# Patient Record
Sex: Female | Born: 1960
Health system: Southern US, Community
[De-identification: ages and names within clinical notes are randomized; demographics above are authoritative.]

## PROBLEM LIST (undated history)

## (undated) HISTORY — PX: ABDOMINAL HYSTERECTOMY: SHX81

## (undated) HISTORY — PX: HEMORRHOID SURGERY: SHX153

---

## 2003-10-24 ENCOUNTER — Inpatient Hospital Stay (HOSPITAL_COMMUNITY): Admission: RE | Admit: 2003-10-24 | Discharge: 2003-10-31 | Payer: Self-pay | Admitting: General Surgery

## 2005-03-06 ENCOUNTER — Ambulatory Visit (HOSPITAL_COMMUNITY): Admission: RE | Admit: 2005-03-06 | Discharge: 2005-03-06 | Payer: Self-pay | Admitting: Pulmonary Disease

## 2005-04-20 IMAGING — CR DG CHEST 2V
2 series · 2 of 2 positions shown · non-contrast
Comparison: none

CLINICAL DATA: Fever status post hemorrhoidectomy.
 CHEST TWO VIEWS 
 Normal heart size, mediastinal contours, and vascularity.  Subsegmental atelectasis left lower lobe.  Mild bronchitic changes.  Lungs otherwise clear.  Bones unremarkable. 
 IMPRESSION
 Subsegmental atelectasis left lower lobe.

[view not recorded (1 of 2)]
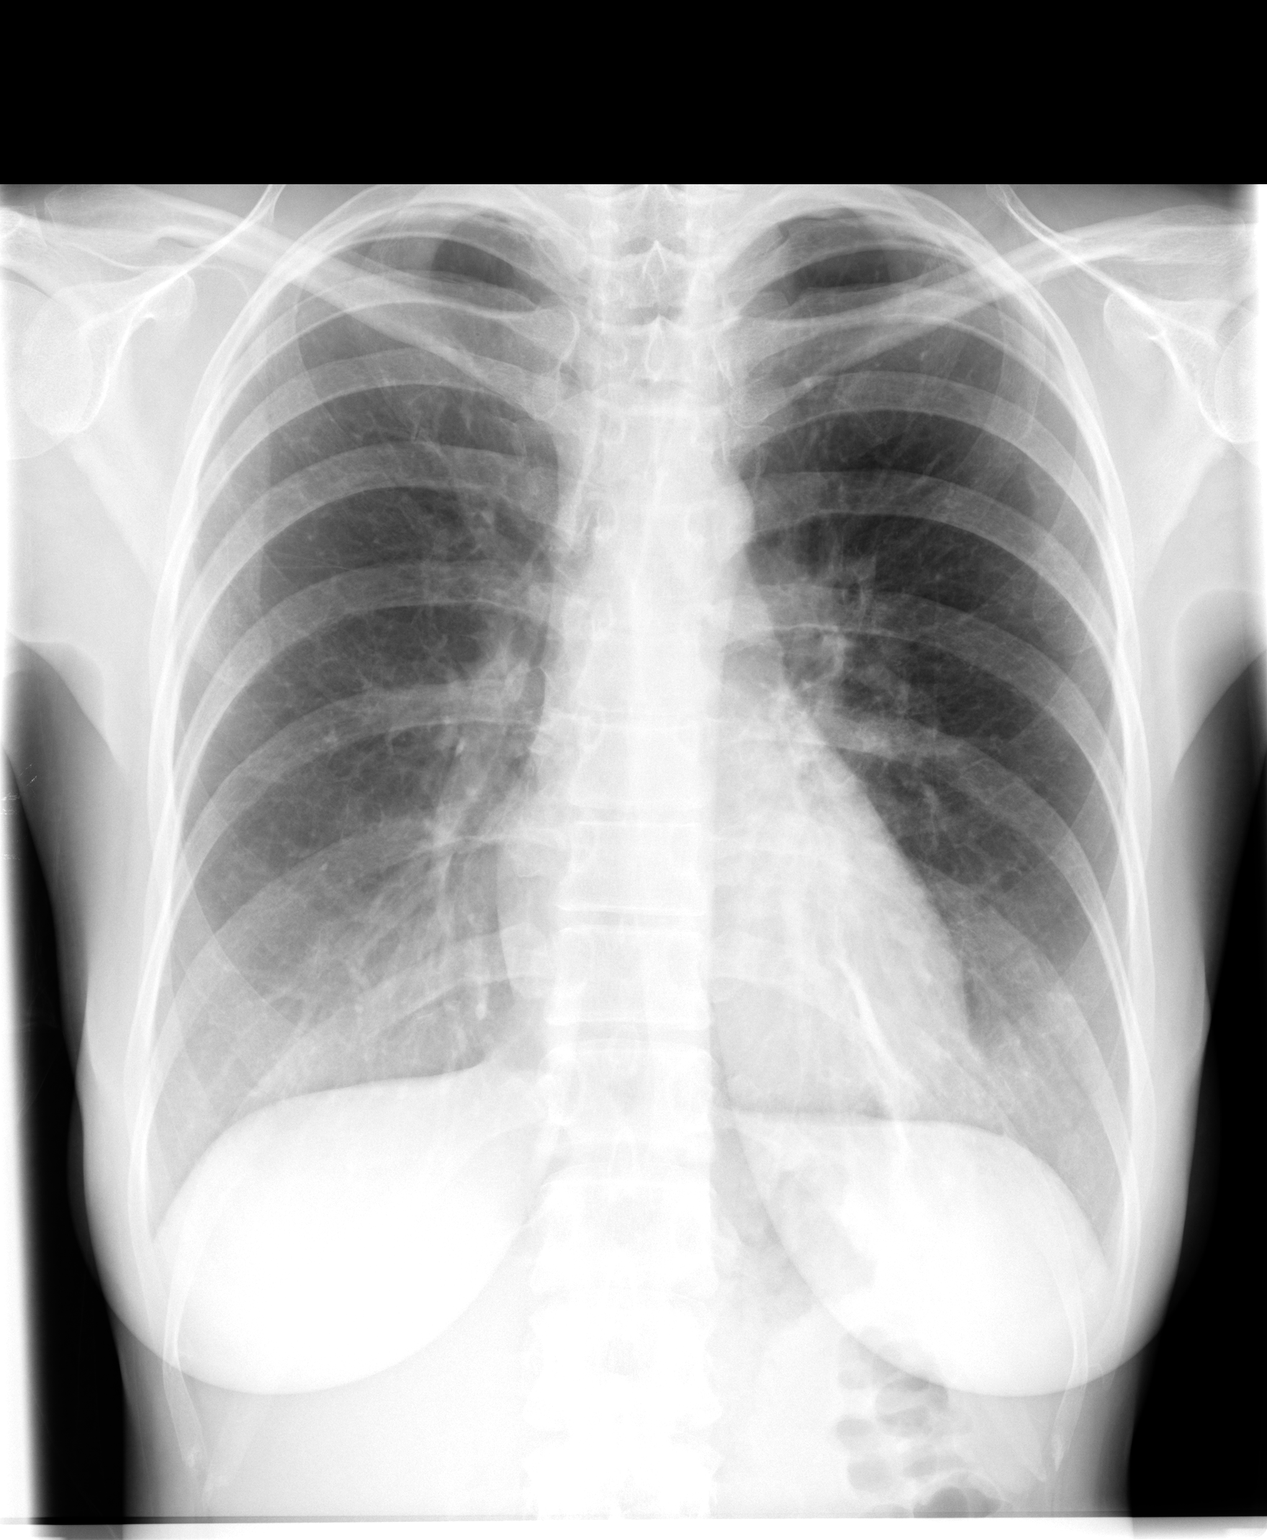

[view not recorded (2 of 2)]
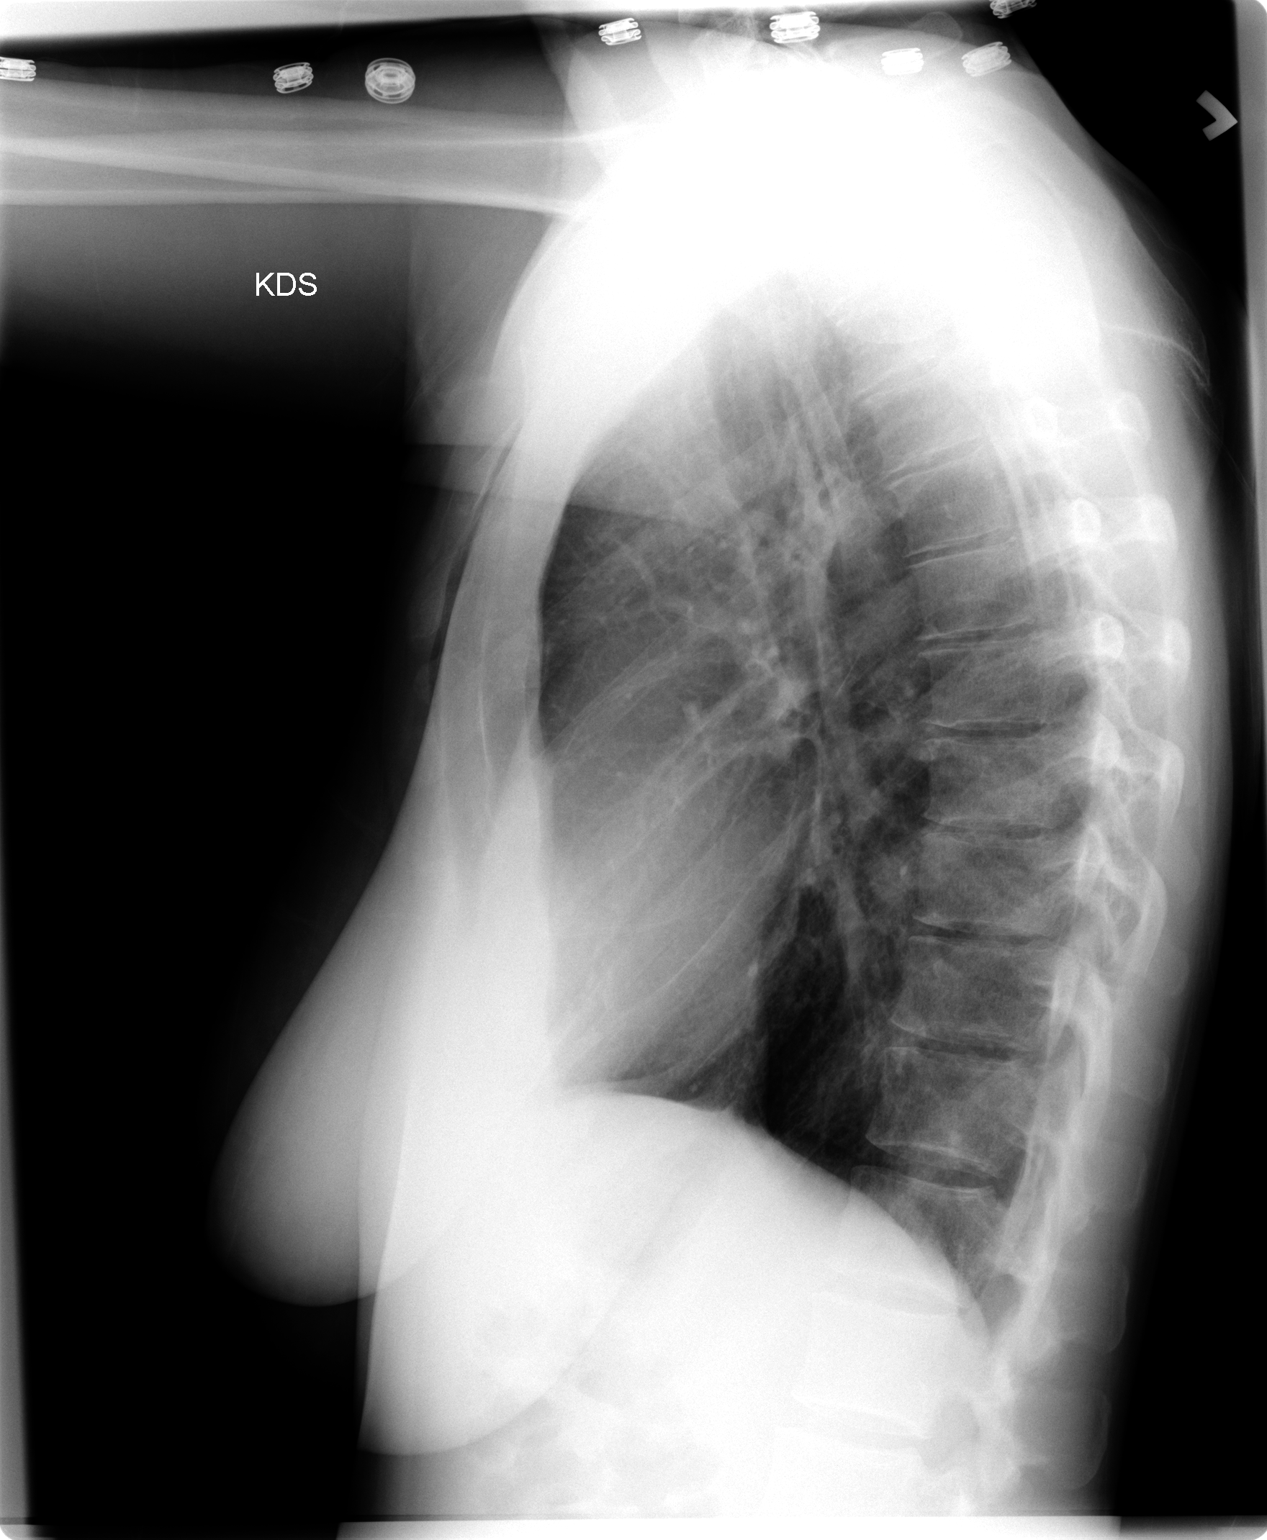

[2 of 2 positions shown; findings below may reference images not displayed]

## 2005-07-02 ENCOUNTER — Ambulatory Visit (HOSPITAL_COMMUNITY): Admission: RE | Admit: 2005-07-02 | Discharge: 2005-07-02 | Payer: Self-pay | Admitting: Pulmonary Disease

## 2006-06-21 ENCOUNTER — Ambulatory Visit (HOSPITAL_COMMUNITY): Admission: RE | Admit: 2006-06-21 | Discharge: 2006-06-21 | Payer: Self-pay | Admitting: Pulmonary Disease

## 2007-11-28 ENCOUNTER — Encounter (INDEPENDENT_AMBULATORY_CARE_PROVIDER_SITE_OTHER): Payer: Self-pay | Admitting: *Deleted

## 2007-11-28 ENCOUNTER — Ambulatory Visit (HOSPITAL_COMMUNITY): Admission: RE | Admit: 2007-11-28 | Discharge: 2007-11-28 | Payer: Self-pay | Admitting: *Deleted

## 2010-05-01 ENCOUNTER — Ambulatory Visit (HOSPITAL_COMMUNITY)
Admission: RE | Admit: 2010-05-01 | Discharge: 2010-05-01 | Payer: Self-pay | Source: Home / Self Care | Attending: Pulmonary Disease | Admitting: Pulmonary Disease

## 2010-10-04 NOTE — Op Note (Signed)
NAME:  Christine Marquez, Christine Marquez              ACCOUNT NO.:  [id number]   MEDICAL RECORD NO.:  [id number]          PATIENT TYPE:  OIB   LOCATION:  9319                          FACILITY:  [facility]   PHYSICIAN:  Gloria [place] B. Earlene Plater, M.D.  DATE OF BIRTH:  [birth year]   DATE OF PROCEDURE:  DATE OF DISCHARGE:  11/28/2007                               OPERATIVE REPORT   PREOPERATIVE DIAGNOSES:  Abnormal bleeding and fibroids.   POSTOPERATIVE DIAGNOSES:  Abnormal bleeding and fibroids.   PROCEDURES:  Total laparoscopic hysterectomy and right salpingo-  oophorectomy.   SURGEON:  Chester Holstein. Earlene Plater, MD   ASSISTANT:  Lendon Colonel, MD   ANESTHESIA:  General.   FINDINGS:  A 250-gram uterus and cervix, Fitz-Hugh-Curtis type changes  at the anterior surface of the liver to the intra-abdominal wall, and  right ovary adherent to the uterus posteriorly.   SPECIMEN:  Uterus, cervix, right tube, and ovary to pathology.   BLOOD LOSS:  100.   COMPLICATIONS:  None.   INDICATIONS:  The patient with a history of heavy menstrual bleeding not  responding to nonsteroidals and hormonal manipulation.  Ultrasound  showed intramural fibroid, otherwise no abnormalities.  The patient  advised the risks of surgery including infection, bleeding, and damage  to surrounding organs.   PROCEDURE:  The patient was taken to the operating room and general  anesthesia was obtained.  She was prepped and draped in a standard  fashion.  A Foley catheter inserted to the bladder.  Uterus sounded to 6  cm and #6 RUMI tip sampled, inserted, and secured.   A 10-mm incision placed in umbilicus.  Fascia was divided sharply and  elevated.  Posterior sheath and peritoneum elevated sharply.  Pursestring suture of 0 Vicryl placed on the fascial defect.  Hasson  cannula inserted and secured.  Pneumoperitoneum obtained with CO2 gas.  The 5 mm ports placed bilaterally periumbilically in left lower quadrant  all under direct laparoscopic  visualization.   A Trendelenburg position obtained.  Bowel mobilized superiorly.  Pelvis  inspected with above findings noted.  Course of the ureters identified  and found to be well away.  The left round ligament was placed on  traction, sealed and divided with the harmonic.  Left tube and left  utero-ovarian pedicle similarly sealed and divided.  Bladder flap was  dissected sharply with the harmonic.  Left broad ligament dissected  sharply with the harmonic to expose uterine artery.   The left uterine artery was then sealed and divided with gyrus bipolar  at the level of the KOH ring.   The right cornu was slightly deviated to the right of the midline, some  adhesions, and shortening of the right round ligament.  It was sealed  and divided with the harmonic.  Right ovary was adherent to the uterus  and the dissection with the harmonic part of the blood supply from the  IP ligament was compromised.  Therefore, I decided to remove the ovary  rather than leave it and have increased risk for necrosis.  Therefore,  the entire right IP ligament was sealed and  divided with a gyrus bipolar  and divided sharply.  The broad ligament was then skeletonized with the  harmonic and the right uterine artery sealed and divided with the gyrus  bipolar at the level of the KOH ring.   Anterior colpotomy was made with a harmonic scalpel on the ring  posteriorly in similar fashion.  Angles were taken down with bipolar.  Uterus, cervix, right tube, and ovary were delivered into the vagina.  This maintained pneumoperitoneum.   The vaginal cuff was inspected.  Course of the ureter reinspected on the  right and although appeared to be clear of the line of dissection to be  sure and decided to proceed with cystoscopy after closure of the vaginal  cuff, therefore indigo carmine was given IV at this point.   The vaginal cuff was then closed with running stitches of the Quill  suture.   Pelvis was  irrigated.  The lines of dissection were hemostatic under low  pressure.  Scope was removed and gas released and cystoscopy performed.  Bilateral ureteral orifices identified with the efflux freely from each  orifice.  The remainder of bladder appeared normal and atraumatic.   The laparoscope reinserted.  Pelvis reinspected.  No abnormalities seen.  Therefore, the case was terminated.  The inferior ports were removed.  Their sites were hemostatic.   Scope was removed.  Gas released.  Hasson cannula removed.  Umbilical  incision elevated with [employer] retractors.  Pursestring suture snugged  down.  This obliterated the fascial defect and no intra-abdominal  contents herniated through prior closure.  Skin was closed with 4-0  Vicryl and Dermabond.   The patient tolerated the procedure well with no complications.  She was  taken to the recovery room awake, alert, and in stable condition.  All  counts correct per the operating staff.      Gerri Spore B. Earlene Plater, M.D.  Electronically Signed     WBD/MEDQ  D:  11/28/2007  T:  11/29/2007  Job:  [id number]

## 2010-10-07 NOTE — H&P (Signed)
NAME:  Christine Marquez, Christine Marquez                        ACCOUNT NO.:  [id number]   MEDICAL RECORD NO.:  [id number]                   PATIENT TYPE:  AMB   LOCATION:  DAY                                  FACILITY:  [facility]   PHYSICIAN:  Jerolyn Shin C. Katrinka Blazing, M.D.                DATE OF BIRTH:  July 08, 1960   DATE OF ADMISSION:  DATE OF DISCHARGE:                                HISTORY & PHYSICAL   HISTORY OF PRESENT ILLNESS:  [age]-year-old female with history of  hemorrhoid disease.  She had hemorrhoidectomy in 1992.  She presented with  the acute onset of painful hemorrhoids with prolapse on 27 April.  She was  treated nonoperatively with some reduction but she remains symptomatic.  She  is now scheduled for hemorrhoidectomy.   PAST HISTORY:  She has no chronic illnesses.   MEDICATIONS:  She takes no medications.   SURGERY:  1. Hemorrhoidectomy in 1992.  2. Left breast biopsy.   ALLERGIES:  PENICILLIN.   FAMILY HISTORY:  Atherosclerotic heart disease, diabetes mellitus,  depression.   PHYSICAL EXAMINATION:  VITAL SIGNS:  On examination, blood pressure 120/70,  pulse 76, respirations 18, weight 137 pounds.  HEENT:  Unremarkable.  NECK:  Neck is supple.  No JVD or bruit.  CHEST:  Chest clear to auscultation.  HEART:  Heart regular rate and rhythm without murmur, gallop or rub.  ABDOMEN:  Abdomen is soft and nontender, no masses.  EXTREMITIES:  No cyanosis, clubbing or edema.  RECTAL:  Prolapse of irritated swollen internal and external hemorrhoids.  NEUROLOGIC EXAM:  No focal motor, sensory or cerebellar deficit.   IMPRESSION:  Hemorrhoid disease.   PLAN:  Hemorrhoidectomy.     ___________________________________________                                         Dirk Dress Katrinka Blazing, M.D.   LCS/MEDQ  D:  10/22/2003  T:  10/23/2003  Job:  [id number]

## 2010-10-07 NOTE — Op Note (Signed)
NAME:  Christine Marquez, Christine Marquez                        ACCOUNT NO.:  [id number]   MEDICAL RECORD NO.:  [id number]                   PATIENT TYPE:  AMB   LOCATION:  DAY                                  FACILITY:  [facility]   PHYSICIAN:  Jerolyn Shin C. Katrinka Blazing, M.D.                DATE OF BIRTH:  [birth year]   DATE OF PROCEDURE:  10/23/2003  DATE OF DISCHARGE:                                 OPERATIVE REPORT   PREOPERATIVE DIAGNOSIS:  Internal and external hemorrhoids.   POSTOPERATIVE DIAGNOSIS:  Internal and external hemorrhoids.   PROCEDURE:  Internal and external hemorrhoidectomy.   SURGEON:  Dirk Dress. Katrinka Blazing, M.D.   DESCRIPTION OF PROCEDURE:  Under spinal anesthesia, the perianal area and  the anal canal were prepped and draped in a sterile field.   Inspection revealed four major complexes of hemorrhoidal cushions.  Each  cushion was ligated at the apex with 2-0 chromic suture.  Each of the  internal and external components were excised, taking care not to injure the  sphincter complexes.  Hemostasis was achieved.  The mucosa and submucosa and  skin were closed with running locking 2-0 chromic.  This was done in four  different __________.  Cosmetic result was good.  The anal sphincter was not  significantly tight.  The area was infiltrated with 30 cc of 0.5% Marcaine  with epinephrine.   The patient tolerated the procedure well.  She was transferred to a bed and  taken to the postanesthetic care unit for monitoring.      ___________________________________________                                            Dirk Dress. Katrinka Blazing, M.D.   LCS/MEDQ  D:  10/23/2003  T:  10/23/2003  Job:  [id number]

## 2010-10-07 NOTE — Discharge Summary (Signed)
NAME:  Christine Marquez, Christine Marquez                        ACCOUNT NO.:  [id number]   MEDICAL RECORD NO.:  [id number]                   PATIENT TYPE:  INP   LOCATION:  A308                                 FACILITY:  [facility]   PHYSICIAN:  Dirk Dress. Katrinka Blazing, M.D.                DATE OF BIRTH:  01-18-1961   DATE OF ADMISSION:  10/23/2003  DATE OF DISCHARGE:  10/31/2003                                 DISCHARGE SUMMARY   DISCHARGE DIAGNOSES:  1. Hemorrhoid disease.  2. Postoperative ileus.  3. Postoperative pneumonia.  4. Postoperative urinary retention.  5. Postoperative anemia.  6. Perioral herpes simplex.   DISPOSITION:  The patient is discharged home in a stable and improved  condition.   She will have followup in the office in two weeks post discharge.   DISCHARGE MEDICATIONS:  1. Dilaudid 4 mg q.4h. p.r.n. pain.  2. Milk of Magnesia 30 cc two or three times daily.  3. Reglan 10 mg a.c. and h.s.  4. Hemocyte Plus one every day.  5. Potassium chloride 20 mEq every day.  6. Levaquin 750 mg every day.  7. Novacort gel to apply to perianal area every four hours as needed.   SUMMARY:  A 50 year old female with a history of hemorrhoid disease.  She is  status post hemorrhoidectomy in 1992.  She had acute onset of painful  hemorrhoids with prolapse on September 16, 2003.  She was treated non-  operatively with some reduction in size of her hemorrhoids, but she remained  symptomatic.  She was scheduled for a hemorrhoidectomy.  Past history is  unremarkable otherwise.  General examination was unremarkable, except for  prolapse of irritated, swollen, internal and external hemorrhoids.   HOSPITAL COURSE:  The patient was admitted through day surgery, on October 23, 2003, and underwent internal and external hemorrhoidectomy with removal of  four segments of hemorrhoidal cushions.  In the immediate postoperative  period, she had difficulty voiding and a post void residual revealed 900 cc.  There was some  perianal ecchymosis.  We tried to do in-and-out  catheterizations, but she continued to have significant post void residual,  so the Foley catheter was left in place.  Her discharge was delayed because  of this.  On the 6th, she developed temperature elevations to 103.  There  was some concern about the possibility of perianal and perirectal  infections, so she was started on Flagyl and Zosyn.  The next day she had  multiple episodes of nausea with vomiting, and she had multiple liquid  stools.  A CT of the abdomen was done and this only revealed a large volume  of colon and small bowel gas with stool compatible with adynamic ileus.  A  CT of the chest showed left lower lobe infiltrate with associated  atelectasis compatible with pneumonitis.  She was continued on the Zosyn and  Levaquin was added.  The Flagyl was  discontinued.  By October 28, 2003, she was  having multiple bowel movements, but she was tachycardic.  Temperatures were  maximally about 100 degrees.  Clinically her left lower lobe pneumonitis was  improved.  Her ileus was also improved.  By October 29, 2003, she felt much  better.  She was afebrile.  Her abdominal exam was benign, but she developed  some perioral fever blisters involving the upper and lower lips.  This was  treated with Zovirax.  By the 11th, she was significantly improved.  She was  having regular bowel movements.  She was afebrile.  Vital signs were stable.  Lungs were clear.  The perianal area revealed mild ecchymosis with swelling  of the external skin tags.  The herpetic lesions around her mouth were  improved, and her pneumonitis was resolving.  It was felt that she was  stable enough to be discharged, and she was discharged home on the above  medications with plans for followup in two weeks.     ___________________________________________                                         Dirk Dress Katrinka Blazing, M.D.   LCS/MEDQ  D:  12/05/2003  T:  12/05/2003  Job:  [id number]

## 2011-02-16 LAB — CBC
HCT: 33.3 — ABNORMAL LOW
Hemoglobin: 11.4 — ABNORMAL LOW
MCHC: 34.4
MCV: 91.7
Platelets: 264
RBC: 3.63 — ABNORMAL LOW
RDW: 14
WBC: 4.5

## 2011-02-16 LAB — DIFFERENTIAL
Basophils Absolute: 0
Basophils Relative: 0
Eosinophils Absolute: 0
Eosinophils Relative: 1
Lymphocytes Relative: 30
Lymphs Abs: 1.4
Monocytes Absolute: 0.4
Monocytes Relative: 10
Neutro Abs: 2.6
Neutrophils Relative %: 59

## 2011-02-16 LAB — PREGNANCY, URINE: Preg Test, Ur: NEGATIVE

## 2013-09-28 ENCOUNTER — Other Ambulatory Visit: Payer: Self-pay | Admitting: Pulmonary Disease

## 2013-09-28 ENCOUNTER — Ambulatory Visit (HOSPITAL_COMMUNITY)
Admission: RE | Admit: 2013-09-28 | Discharge: 2013-09-28 | Disposition: A | Discharge status: Home / Self Care | Payer: 59 | Source: Ambulatory Visit | Attending: Pulmonary Disease | Admitting: Pulmonary Disease

## 2013-09-28 DIAGNOSIS — R091 Pleurisy: Secondary | ICD-10-CM

## 2013-09-28 DIAGNOSIS — R071 Chest pain on breathing: Secondary | ICD-10-CM | POA: Insufficient documentation

## 2013-10-03 ENCOUNTER — Other Ambulatory Visit (HOSPITAL_COMMUNITY): Payer: Self-pay | Admitting: Pulmonary Disease

## 2013-10-03 DIAGNOSIS — K30 Functional dyspepsia: Secondary | ICD-10-CM

## 2013-10-06 ENCOUNTER — Ambulatory Visit (HOSPITAL_COMMUNITY)
Admission: RE | Admit: 2013-10-06 | Discharge: 2013-10-06 | Disposition: A | Discharge status: Home / Self Care | Payer: 59 | Source: Ambulatory Visit | Attending: Pulmonary Disease | Admitting: Pulmonary Disease

## 2013-10-06 DIAGNOSIS — K3189 Other diseases of stomach and duodenum: Secondary | ICD-10-CM | POA: Insufficient documentation

## 2013-10-06 DIAGNOSIS — R1013 Epigastric pain: Principal | ICD-10-CM

## 2013-10-06 DIAGNOSIS — K30 Functional dyspepsia: Secondary | ICD-10-CM

## 2013-10-07 ENCOUNTER — Ambulatory Visit (HOSPITAL_COMMUNITY): Payer: 59

## 2014-11-17 ENCOUNTER — Emergency Department (HOSPITAL_COMMUNITY): Payer: 59

## 2014-11-17 ENCOUNTER — Emergency Department (HOSPITAL_COMMUNITY)
Admission: EM | Admit: 2014-11-17 | Discharge: 2014-11-17 | Disposition: A | Discharge status: Home / Self Care | Payer: 59 | Attending: Emergency Medicine | Admitting: Emergency Medicine

## 2014-11-17 ENCOUNTER — Encounter (HOSPITAL_COMMUNITY): Payer: Self-pay | Admitting: Emergency Medicine

## 2014-11-17 DIAGNOSIS — R079 Chest pain, unspecified: Secondary | ICD-10-CM | POA: Diagnosis not present

## 2014-11-17 DIAGNOSIS — R200 Anesthesia of skin: Secondary | ICD-10-CM | POA: Diagnosis not present

## 2014-11-17 DIAGNOSIS — Z880 Allergy status to penicillin: Secondary | ICD-10-CM | POA: Diagnosis not present

## 2014-11-17 DIAGNOSIS — M549 Dorsalgia, unspecified: Secondary | ICD-10-CM | POA: Insufficient documentation

## 2014-11-17 DIAGNOSIS — H538 Other visual disturbances: Secondary | ICD-10-CM | POA: Insufficient documentation

## 2014-11-17 DIAGNOSIS — R202 Paresthesia of skin: Secondary | ICD-10-CM | POA: Insufficient documentation

## 2014-11-17 DIAGNOSIS — Z720 Tobacco use: Secondary | ICD-10-CM | POA: Insufficient documentation

## 2014-11-17 LAB — COMPREHENSIVE METABOLIC PANEL
ALT: 20 U/L (ref 14–54)
AST: 21 U/L (ref 15–41)
Albumin: 4.4 g/dL (ref 3.5–5.0)
Alkaline Phosphatase: 92 U/L (ref 38–126)
Anion gap: 8 (ref 5–15)
BUN: 13 mg/dL (ref 6–20)
CO2: 31 mmol/L (ref 22–32)
Calcium: 9.3 mg/dL (ref 8.9–10.3)
Chloride: 105 mmol/L (ref 101–111)
Creatinine, Ser: 0.58 mg/dL (ref 0.44–1.00)
GFR calc Af Amer: >60 mL/min (ref >60)
GFR calc non Af Amer: >60 mL/min (ref >60)
Glucose, Bld: 87 mg/dL (ref 65–99)
Potassium: 4.4 mmol/L (ref 3.5–5.1)
Sodium: 144 mmol/L (ref 135–145)
Total Bilirubin: 0.8 mg/dL (ref 0.3–1.2)
Total Protein: 7.1 g/dL (ref 6.5–8.1)

## 2014-11-17 LAB — CBC WITH DIFFERENTIAL/PLATELET
Basophils Absolute: 0 10*3/uL (ref 0.0–0.1)
Basophils Relative: 0 % (ref 0–1)
Eosinophils Absolute: 0 10*3/uL (ref 0.0–0.7)
Eosinophils Relative: 1 % (ref 0–5)
HCT: 40.1 % (ref 36.0–46.0)
Hemoglobin: 12.9 g/dL (ref 12.0–15.0)
Lymphocytes Relative: 29 % (ref 12–46)
Lymphs Abs: 1.5 10*3/uL (ref 0.7–4.0)
MCH: 31.4 pg (ref 26.0–34.0)
MCHC: 32.2 g/dL (ref 30.0–36.0)
MCV: 97.6 fL (ref 78.0–100.0)
Monocytes Absolute: 0.4 10*3/uL (ref 0.1–1.0)
Monocytes Relative: 8 % (ref 3–12)
Neutro Abs: 3.3 10*3/uL (ref 1.7–7.7)
Neutrophils Relative %: 62 % (ref 43–77)
Platelets: 186 10*3/uL (ref 150–400)
RBC: 4.11 MIL/uL (ref 3.87–5.11)
RDW: 13.9 % (ref 11.5–15.5)
WBC: 5.2 10*3/uL (ref 4.0–10.5)

## 2014-11-17 NOTE — ED Notes (Signed)
Pt made aware to return if symptoms worsen or if any life threatening symptoms occur.   

## 2014-11-17 NOTE — Discharge Instructions (Signed)
Follow-up with your doctor. Make an appointment. Also referral information given to neurology Dr. Merlene Laughter if youneed to follow-up with him. Return for any new or worse symptoms.

## 2014-11-17 NOTE — ED Notes (Signed)
Pt reports intermittent episodes of R sided facial numbness and tingling since last week. No neuro deficits noted.

## 2014-11-17 NOTE — ED Provider Notes (Addendum)
CSN: [id number]     Arrival date & time 11/17/14  1109 History  This chart was scribed for Fredia Sorrow, MD by Stephania Fragmin, ED Scribe. This patient was seen in room APA18/APA18 and the patient's care was started at 12:48 PM.    Chief Complaint  Patient presents with  . Numbness   The history is provided by the patient. No language interpreter was used.     HPI Comments: Christine Marquez is a 54 y.o. female who presents to the Emergency Department complaining of 3 intermittent episodes of numbness around her generalized face, right greater than left, that began last week and with the latest episode occurring PTA. She also complains of intermittent center chest pain radiating to her back that has been ongoing for a few weeks, although she adds she is unsure whether it is back pain radiating to her chest or vice versa. She also reports blurred vision in her bilateral eyes that has been ongoing for 2-3 months, which was diagnosed by her doctor as cataracts, but she denies any recent acute changes. She also denies fevers, chills, cough, rhinorrhea, sore throat, SOB, abdominal pain, nausea, vomiting, diarrhea, dysuria, leg swelling, bleeding easily, rash, headache, weakness, or numbness on any area of her body besides her head.  PCP: Dr. Nevada Crane  History reviewed. No pertinent past medical history. Past Surgical History  Procedure Laterality Date  . Hemorrhoid surgery     Family History  Problem Relation Age of Onset  . Heart failure Father   . Cancer Other   . Diabetes Other    History  Substance Use Topics  . Smoking status: Current Every Day Smoker    Types: Cigarettes  . Smokeless tobacco: Never Used  . Alcohol Use: No   OB History    Gravida Para Term Preterm AB TAB SAB Ectopic Multiple Living   2 2 2             Review of Systems  Constitutional: Negative for fever and chills.  HENT: Negative for rhinorrhea and sore throat.   Eyes: Positive for visual disturbance (blurred  vision).  Respiratory: Negative for cough and shortness of breath.   Cardiovascular: Positive for chest pain. Negative for leg swelling.  Gastrointestinal: Negative for nausea, vomiting, abdominal pain and diarrhea.  Genitourinary: Negative for dysuria.  Musculoskeletal: Positive for back pain.  Skin: Negative for rash.  Neurological: Positive for numbness. Negative for weakness and headaches.  Hematological: Does not bruise/bleed easily.    Allergies  Penicillins  Home Medications   Prior to Admission medications   Medication Sig Start Date End Date Taking? Authorizing Provider  acetaminophen (TYLENOL) 325 MG tablet Take 650 mg by mouth every 6 (six) hours as needed for moderate pain.   Yes Historical Provider, MD   BP 124/80 mmHg  Pulse 78  Temp(Src) 98 F (36.7 C) (Oral)  Resp 15  Ht 5\' 7"  (1.702 m)  Wt 139 lb (63.05 kg)  BMI 21.77 kg/m2  SpO2 99% Physical Exam  Constitutional: She is oriented to person, place, and time. She appears well-developed and well-nourished. No distress.  HENT:  Head: Normocephalic and atraumatic.  Mouth/Throat: Oropharynx is clear and moist.  Moist mucous membranes.  Eyes: Conjunctivae and EOM are normal. Pupils are equal, round, and reactive to light. No scleral icterus.  Eyes track normal.  Neck: Neck supple. No tracheal deviation present.  Cardiovascular: Normal rate, regular rhythm and normal heart sounds.   Pulmonary/Chest: Effort normal. No respiratory distress.  Lungs  are clear to auscultation bilaterally.  Abdominal: Soft. Bowel sounds are normal. There is no tenderness. There is no rebound.  Musculoskeletal: Normal range of motion.  No ankle swelling.  Neurological: She is alert and oriented to person, place, and time.  Skin: Skin is warm and dry.  Psychiatric: She has a normal mood and affect. Her behavior is normal.  Nursing note and vitals reviewed.   ED Course  Procedures (including critical care time)   DIAGNOSTIC  STUDIES: Oxygen Saturation is 96% on RA, normal by my interpretation.    COORDINATION OF CARE: 12:47 PM - Discussed treatment plan with pt at bedside which includes diagnostic lab tests and CT head/MRI, and pt agreed to plan.   Labs Review Labs Reviewed  CBC WITH DIFFERENTIAL/PLATELET  COMPREHENSIVE METABOLIC PANEL   Results for orders placed or performed during the hospital encounter of 11/17/14  CBC with Differential/Platelet  Result Value Ref Range   WBC 5.2 4.0 - 10.5 K/uL   RBC 4.11 3.87 - 5.11 MIL/uL   Hemoglobin 12.9 12.0 - 15.0 g/dL   HCT 40.1 36.0 - 46.0 %   MCV 97.6 78.0 - 100.0 fL   MCH 31.4 26.0 - 34.0 pg   MCHC 32.2 30.0 - 36.0 g/dL   RDW 13.9 11.5 - 15.5 %   Platelets 186 150 - 400 K/uL   Neutrophils Relative % 62 43 - 77 %   Neutro Abs 3.3 1.7 - 7.7 K/uL   Lymphocytes Relative 29 12 - 46 %   Lymphs Abs 1.5 0.7 - 4.0 K/uL   Monocytes Relative 8 3 - 12 %   Monocytes Absolute 0.4 0.1 - 1.0 K/uL   Eosinophils Relative 1 0 - 5 %   Eosinophils Absolute 0.0 0.0 - 0.7 K/uL   Basophils Relative 0 0 - 1 %   Basophils Absolute 0.0 0.0 - 0.1 K/uL  Comprehensive metabolic panel  Result Value Ref Range   Sodium 144 135 - 145 mmol/L   Potassium 4.4 3.5 - 5.1 mmol/L   Chloride 105 101 - 111 mmol/L   CO2 31 22 - 32 mmol/L   Glucose, Bld 87 65 - 99 mg/dL   BUN 13 6 - 20 mg/dL   Creatinine, Ser 0.58 0.44 - 1.00 mg/dL   Calcium 9.3 8.9 - 10.3 mg/dL   Total Protein 7.1 6.5 - 8.1 g/dL   Albumin 4.4 3.5 - 5.0 g/dL   AST 21 15 - 41 U/L   ALT 20 14 - 54 U/L   Alkaline Phosphatase 92 38 - 126 U/L   Total Bilirubin 0.8 0.3 - 1.2 mg/dL   GFR calc non Af Amer >60 >60 mL/min   GFR calc Af Amer >60 >60 mL/min   Anion gap 8 5 - 15     Imaging Review Dg Chest 2 View  11/17/2014   CLINICAL DATA:  Intermittent episodes of right facial numbness and tingling starting last week.  EXAM: CHEST  2 VIEW  COMPARISON:  09/28/2013  FINDINGS: Tapering of the peripheral pulmonary vasculature  favors emphysema.  Stable vague anterior lingular density, not perfectly changed from last year. Heart size within normal limits. Biapical pleural parenchymal scarring. Mild thoracic spondylosis.  No pleural effusion identified.  IMPRESSION: 1. Emphysema. 2. Suspected lingular scarring. 3. No acute findings.   Electronically Signed   By: Van Clines M.D.   On: 11/17/2014 15:25   Ct Head Wo Contrast  11/17/2014   CLINICAL DATA:  Right facial numbness and tingling.  EXAM: CT HEAD WITHOUT CONTRAST  TECHNIQUE: Contiguous axial images were obtained from the base of the skull through the vertex without intravenous contrast.  COMPARISON:  None.  FINDINGS: Bony calvarium appears intact. No mass effect or midline shift is noted. Ventricular size is within normal limits. There is no evidence of mass lesion, hemorrhage or acute infarction.  IMPRESSION: Normal head CT.   Electronically Signed   By: Marijo Conception, M.D.   On: 11/17/2014 14:33   Mr Brain Wo Contrast  11/17/2014   CLINICAL DATA:  Right-sided facial numbness and weakness for 1 day.  EXAM: MRI HEAD WITHOUT CONTRAST  TECHNIQUE: Multiplanar, multiecho pulse sequences of the brain and surrounding structures were obtained without intravenous contrast.  COMPARISON:  CT of the head 11/17/2014  FINDINGS: No acute infarct, hemorrhage, or mass lesion is present. The ventricles are of normal size. There is no significant white matter disease. Minimal periventricular changes are within normal limits for age. No significant extraaxial fluid collection is present.  Flow is present in the major intracranial arteries. The the globes and orbits are intact. The paranasal sinuses and mastoid air cells are clear.  The skullbase is within normal limits. Midline structures are normal.  IMPRESSION: Negative MRI of the brain.   Electronically Signed   By: San Morelle M.D.   On: 11/17/2014 16:28     EKG Interpretation   Date/Time:  Tuesday November 17 2014 11:20:58  EDT Ventricular Rate:  87 PR Interval:  162 QRS Duration: 88 QT Interval:  346 QTC Calculation: 416 R Axis:   63 Text Interpretation:  Sinus rhythm Confirmed by Jermey Closs  MD, Cletus Paris  ([id number]) on 11/17/2014 11:31:24 AM      MDM   Final diagnoses:  Numbness and tingling  Chest pain    Patient resents with intermittent numbness and tingling to the the face right greater than left and to the head. Ongoing goal on and off for a period of several weeks. No tingling or numbness anywhere else in the body. Also associated with some intermittent anterior chest pain radiating to the back. Not consistent. Has been going on for past several days. Patient works Engineer, civil (consulting) as a Marine scientist. Patient primary care doctor is Dr. Luan Pulling.  Head CT was negative labs without any sniffing abnormality. EKG without any acute changes. Chest x-ray negative for any acute cardiopulmonary findings no pneumothorax no pneumonia no pulmonary edema. Clinically not concerned about an acute cardiac event. Also no hypoxia not consistent with a pulmonary embolus.  MRI ordered for completeness sake for the patient.   If MRI negative patient will be discharged for follow-up with her primary care doctor also given referral information to neurology.    MRI without any acute findings. We'll have patient follow-up with her primary care doctor and neurology.    I personally performed the services described in this documentation, which was scribed in my presence. The recorded information has been reviewed and is accurate.      Fredia Sorrow, MD 11/17/14 1624  Fredia Sorrow, MD 11/17/14 [phone]

## 2015-02-28 ENCOUNTER — Telehealth: Payer: 59 | Admitting: Family

## 2015-02-28 DIAGNOSIS — J208 Acute bronchitis due to other specified organisms: Secondary | ICD-10-CM

## 2015-02-28 MED ORDER — BENZONATATE 100 MG PO CAPS: 100.0000 mg | ORAL_CAPSULE | Freq: Three times a day (TID) | ORAL | Status: AC | PRN

## 2015-02-28 NOTE — Progress Notes (Signed)
We are sorry that you are not feeling well.  Here is how we plan to help!  Based on what you have shared with me it looks like you have upper respiratory tract inflammation that has resulted in a significant cough.  Inflammation and infection in the upper respiratory tract is commonly called bronchitis and has four common causes:  Allergies, Viral Infections, Acid Reflux and Bacterial Infections.  Allergies, viruses and acid reflux are treated by controlling symptoms or eliminating the cause. An example might be a cough caused by taking certain blood pressure medications. You stop the cough by changing the medication. Another example might be a cough caused by acid reflux. Controlling the reflux helps control the cough.  Based on your presentation I believe you most likely have A cough due to a virus.  This is called viral bronchitis and is best treated by rest, plenty of fluids and control of the cough.  You may use Ibuprofen or Tylenol as directed to help your symptoms.    In addition you may use A non-prescription cough medication called Robitussin DAC. Take 2 teaspoons every 8 hours or Delsym: take 2 teaspoons every 12 hours. and A prescription cough medication called Tessalon Perles 100mg . You may take 1-2 capsules every 8 hours as needed for your cough.    HOME CARE . Only take medications as instructed by your medical team. . Complete the entire course of an antibiotic. . Drink plenty of fluids and get plenty of rest. . Avoid close contacts especially the very young and the elderly . Cover your mouth if you cough or cough into your sleeve. . Always remember to wash your hands . A steam or ultrasonic humidifier can help congestion.    GET HELP RIGHT AWAY IF: . You develop worsening fever. . You become short of breath . You cough up blood. . Your symptoms persist after you have completed your treatment plan MAKE SURE YOU   Understand these instructions.  Will watch your  condition.  Will get help right away if you are not doing well or get worse.  Your e-visit answers were reviewed by a board certified advanced clinical practitioner to complete your personal care plan.  Depending on the condition, your plan could have included both over the counter or prescription medications. If there is a problem please reply  once you have received a response from your provider. Your safety is important to [place].  If you have drug allergies check your prescription carefully.    You can use MyChart to ask questions about today's visit, request a non-urgent call back, or ask for a work or school excuse for 24 hours related to this e-Visit. If it has been greater than 24 hours you will need to follow up with your provider, or enter a new e-Visit to address those concerns. You will get an e-mail in the next two days asking about your experience.  I hope that your e-visit has been valuable and will speed your recovery. Thank you for using e-visits.

## 2016-01-13 ENCOUNTER — Telehealth: Payer: 59 | Admitting: Physician Assistant

## 2016-01-13 DIAGNOSIS — R399 Unspecified symptoms and signs involving the genitourinary system: Secondary | ICD-10-CM | POA: Diagnosis not present

## 2016-01-13 MED ORDER — NITROFURANTOIN MONOHYD MACRO 100 MG PO CAPS: 100.0000 mg | ORAL_CAPSULE | Freq: Two times a day (BID) | ORAL | 0 refills | Status: DC

## 2016-01-13 NOTE — Progress Notes (Signed)
We are sorry that you are not feeling well.  Here is how we plan to help!  Based on what you shared with me it looks like you most likely have a simple urinary tract infection.  A UTI (Urinary Tract Infection) is a bacterial infection of the bladder.  Most cases of urinary tract infections are simple to treat but a key part of your care is to encourage you to drink plenty of fluids and watch your symptoms carefully.  I have prescribed MacroBid 100 mg twice a day for 5 days.  I cannot send medication to a pharmacy outside state via e-visits. As noted in the disclaimer on the homepage, you need to give me a pharmacy in [place] I can send the prescription to. Please let me know so I can help you feel better!  Your symptoms should gradually improve. Call us if the burning in your urine worsens, you develop worsening fever, back pain or pelvic pain or if your symptoms do not resolve after completing the antibiotic.  Urinary tract infections can be prevented by drinking plenty of water to keep your body hydrated.  Also be sure when you wipe, wipe from front to back and don't hold it in!  If possible, empty your bladder every 4 hours.  Your e-visit answers were reviewed by a board certified advanced clinical practitioner to complete your personal care plan.  Depending on the condition, your plan could have included both over the counter or prescription medications.  If there is a problem please reply  once you have received a response from your provider.  Your safety is important to [place].  If you have drug allergies check your prescription carefully.    You can use MyChart to ask questions about today's visit, request a non-urgent call back, or ask for a work or school excuse for 24 hours related to this e-Visit. If it has been greater than 24 hours you will need to follow up with your provider, or enter a new e-Visit to address those concerns.   You will get an e-mail in the next two days asking about your  experience.  I hope that your e-visit has been valuable and will speed your recovery. Thank you for using e-visits.

## 2016-01-13 NOTE — Addendum Note (Signed)
Addended by: Raiford Noble on: 01/13/2016 08:39 PM   Modules accepted: Orders

## 2016-01-13 NOTE — Progress Notes (Signed)
Awaiting patient message for an [place] pharmacy to send medication to.

## 2016-01-21 DIAGNOSIS — H0289 Other specified disorders of eyelid: Secondary | ICD-10-CM | POA: Diagnosis not present

## 2016-01-21 DIAGNOSIS — H47093 Other disorders of optic nerve, not elsewhere classified, bilateral: Secondary | ICD-10-CM | POA: Diagnosis not present

## 2016-01-21 DIAGNOSIS — H2513 Age-related nuclear cataract, bilateral: Secondary | ICD-10-CM | POA: Diagnosis not present

## 2016-09-24 ENCOUNTER — Telehealth: Payer: 59 | Admitting: Family

## 2016-09-24 DIAGNOSIS — B009 Herpesviral infection, unspecified: Secondary | ICD-10-CM

## 2016-09-24 MED ORDER — ACYCLOVIR 400 MG PO TABS: 800.0000 mg | ORAL_TABLET | Freq: Two times a day (BID) | ORAL | 0 refills | Status: DC

## 2016-09-24 NOTE — Progress Notes (Signed)
Duplicate; delete this E-Visit

## 2016-09-24 NOTE — Progress Notes (Signed)
We are sorry that you are not feeling well.  Here is how we plan to help!  Based on what you have shared with me it does look like you have a viral infection.    Most cold sores or fever blisters are small fluid filled blisters around the mouth caused by herpes simplex virus.  The most common strain of the virus causing cold sores is herpes simplex virus 1.  It can be spread by skin contact, sharing eating utensils, or even sharing towels.  Cold sores are contagious to other people until dry. (Approximately 5-7 days).  Wash your hands. You can spread the virus to your eyes through handling your contact lenses after touching the lesions.  Most people experience pain at the sight or tingling sensations in their lips that may begin before the ulcers erupt.  Herpes simplex is treatable but not curable.  It may lie dormant for a long time and then reappear due to stress or prolonged sun exposure.  Many patients have success in treating their cold sores with an over the counter topical called Abreva.  You may apply the cream up to 5 times daily (maximum 10 days) until healing occurs.  If you would like to use an oral antiviral medication to speed the healing of your cold sore, I have sent a prescription to your local pharmacy Acyclovir 800 mg twice a day for 7 days    HOME CARE:   Wash your hands frequently.  Do not pick at or rub the sore.  Don't open the blisters.  Avoid kissing other people during this time.  Avoid sharing drinking glasses, eating utensils, or razors.  Do not handle contact lenses unless you have thoroughly washed your hands with soap and warm water!  Avoid oral sex during this time.  Herpes from sores on your mouth can spread to your partner's genital area.  Avoid contact with anyone who has eczema or a weakened immune system.  Cold sores are often triggered by exposure to intense sunlight, use a lip balm containing a sunscreen (SPF 30 or higher).  GET HELP RIGHT AWAY  IF:   Blisters look infected.  Blisters occur near or in the eye.  Symptoms last longer than 10 days.  Your symptoms become worse.  MAKE SURE YOU:   Understand these instructions.  Will watch your condition.  Will get help right away if you are not doing well or get worse.    Your e-visit answers were reviewed by a board certified advanced clinical practitioner to complete your personal care plan.  Depending upon the condition, your plan could have  Included both over the counter or prescription medications.    Please review your pharmacy choice.  Be sure that the pharmacy you have chosen is open so that you can pick up your prescription now.  If there is a problem you csn message your provider in MyChart to have the prescription routed to another pharmacy.    Your safety is important to us.  If you have drug allergies check our prescription carefully.  For the next 24 hours you can use MyChart to ask questions about today's visit, request a non-urgent call back, or ask for a work or school excuse from your e-visit provider.  You will get an email in the next two days asking about your experience.  I hope that your e-visit has been valuable and will speed your recovery.  

## 2017-10-04 DIAGNOSIS — D485 Neoplasm of uncertain behavior of skin: Secondary | ICD-10-CM | POA: Diagnosis not present

## 2017-10-04 DIAGNOSIS — H2513 Age-related nuclear cataract, bilateral: Secondary | ICD-10-CM | POA: Diagnosis not present

## 2017-10-04 DIAGNOSIS — D3102 Benign neoplasm of left conjunctiva: Secondary | ICD-10-CM | POA: Diagnosis not present

## 2017-11-06 ENCOUNTER — Other Ambulatory Visit (HOSPITAL_COMMUNITY): Payer: Self-pay | Admitting: Pulmonary Disease

## 2017-11-06 DIAGNOSIS — Z780 Asymptomatic menopausal state: Secondary | ICD-10-CM

## 2017-11-06 DIAGNOSIS — Z0000 Encounter for general adult medical examination without abnormal findings: Secondary | ICD-10-CM | POA: Diagnosis not present

## 2017-11-07 ENCOUNTER — Encounter (HOSPITAL_COMMUNITY): Payer: Self-pay

## 2017-11-07 ENCOUNTER — Ambulatory Visit (HOSPITAL_COMMUNITY)
Admission: RE | Admit: 2017-11-07 | Discharge: 2017-11-07 | Disposition: A | Discharge status: Home / Self Care | Payer: 59 | Source: Ambulatory Visit | Attending: Pulmonary Disease | Admitting: Pulmonary Disease

## 2017-11-07 ENCOUNTER — Other Ambulatory Visit (HOSPITAL_COMMUNITY)
Admission: RE | Admit: 2017-11-07 | Discharge: 2017-11-07 | Disposition: A | Discharge status: Home / Self Care | Payer: 59 | Source: Ambulatory Visit | Attending: Pulmonary Disease | Admitting: Pulmonary Disease

## 2017-11-07 ENCOUNTER — Other Ambulatory Visit (HOSPITAL_COMMUNITY): Payer: Self-pay | Admitting: Pulmonary Disease

## 2017-11-07 DIAGNOSIS — K30 Functional dyspepsia: Secondary | ICD-10-CM | POA: Insufficient documentation

## 2017-11-07 DIAGNOSIS — Z1231 Encounter for screening mammogram for malignant neoplasm of breast: Secondary | ICD-10-CM | POA: Diagnosis not present

## 2017-11-07 DIAGNOSIS — Z0000 Encounter for general adult medical examination without abnormal findings: Secondary | ICD-10-CM | POA: Insufficient documentation

## 2017-11-07 DIAGNOSIS — F17200 Nicotine dependence, unspecified, uncomplicated: Secondary | ICD-10-CM | POA: Insufficient documentation

## 2017-11-07 LAB — COMPREHENSIVE METABOLIC PANEL
ALBUMIN: 4.5 g/dL (ref 3.5–5.0)
ALK PHOS: 96 U/L (ref 38–126)
ALT: 21 U/L (ref 14–54)
ANION GAP: 9 (ref 5–15)
AST: 25 U/L (ref 15–41)
BUN: 20 mg/dL (ref 6–20)
CALCIUM: 9.3 mg/dL (ref 8.9–10.3)
CO2: 28 mmol/L (ref 22–32)
Chloride: 104 mmol/L (ref 101–111)
Creatinine, Ser: 0.67 mg/dL (ref 0.44–1.00)
GFR calc Af Amer: >60 mL/min (ref >60)
GFR calc non Af Amer: >60 mL/min (ref >60)
Glucose, Bld: 98 mg/dL (ref 65–99)
Potassium: 4.2 mmol/L (ref 3.5–5.1)
SODIUM: 141 mmol/L (ref 135–145)
Total Bilirubin: 0.5 mg/dL (ref 0.3–1.2)
Total Protein: 7.6 g/dL (ref 6.5–8.1)

## 2017-11-07 LAB — LIPID PANEL
Cholesterol: 196 mg/dL (ref 0–200)
HDL: 82 mg/dL (ref >40)
LDL Cholesterol: 107 mg/dL — ABNORMAL HIGH (ref 0–99)
Total CHOL/HDL Ratio: 2.4 RATIO
Triglycerides: 34 mg/dL (ref <150)
VLDL: 7 mg/dL (ref 0–40)

## 2017-11-07 LAB — CBC
HEMATOCRIT: 43.5 % (ref 36.0–46.0)
HEMOGLOBIN: 13.9 g/dL (ref 12.0–15.0)
MCH: 31.7 pg (ref 26.0–34.0)
MCHC: 32 g/dL (ref 30.0–36.0)
MCV: 99.3 fL (ref 78.0–100.0)
Platelets: 218 10*3/uL (ref 150–400)
RBC: 4.38 MIL/uL (ref 3.87–5.11)
RDW: 13.8 % (ref 11.5–15.5)
WBC: 5.3 10*3/uL (ref 4.0–10.5)

## 2017-11-07 LAB — TSH: TSH: 0.81 u[IU]/mL (ref 0.350–4.500)

## 2017-11-13 ENCOUNTER — Ambulatory Visit (HOSPITAL_COMMUNITY)
Admission: RE | Admit: 2017-11-13 | Discharge: 2017-11-13 | Disposition: A | Discharge status: Home / Self Care | Payer: 59 | Source: Ambulatory Visit | Attending: Pulmonary Disease | Admitting: Pulmonary Disease

## 2017-11-13 DIAGNOSIS — Z780 Asymptomatic menopausal state: Secondary | ICD-10-CM | POA: Diagnosis not present

## 2017-11-13 DIAGNOSIS — M85852 Other specified disorders of bone density and structure, left thigh: Secondary | ICD-10-CM | POA: Diagnosis not present

## 2017-11-16 DIAGNOSIS — Z1212 Encounter for screening for malignant neoplasm of rectum: Secondary | ICD-10-CM | POA: Diagnosis not present

## 2017-11-16 DIAGNOSIS — Z1211 Encounter for screening for malignant neoplasm of colon: Secondary | ICD-10-CM | POA: Diagnosis not present

## 2017-11-23 ENCOUNTER — Other Ambulatory Visit (HOSPITAL_COMMUNITY): Payer: 59

## 2018-01-07 ENCOUNTER — Telehealth: Payer: 59 | Admitting: Family

## 2018-01-07 DIAGNOSIS — R112 Nausea with vomiting, unspecified: Secondary | ICD-10-CM | POA: Diagnosis not present

## 2018-01-07 MED ORDER — ONDANSETRON 4 MG PO TBDP: 4.0000 mg | ORAL_TABLET | Freq: Three times a day (TID) | ORAL | 0 refills | Status: DC | PRN

## 2018-01-07 NOTE — Progress Notes (Signed)

## 2018-04-19 MED FILL — ACYCLOVIR 400 MG TABLET: 400 | 15 days supply | Qty: 30 | Fill #0

## 2018-06-04 MED FILL — ACYCLOVIR 400 MG TABLET: 400 | 15 days supply | Qty: 30 | Fill #1

## 2018-08-16 MED FILL — ACYCLOVIR 400 MG TABLET: 400 | 15 days supply | Qty: 30 | Fill #0

## 2018-08-18 ENCOUNTER — Telehealth: Payer: 59 | Admitting: Family

## 2018-08-18 DIAGNOSIS — A499 Bacterial infection, unspecified: Secondary | ICD-10-CM | POA: Diagnosis not present

## 2018-08-18 DIAGNOSIS — N390 Urinary tract infection, site not specified: Secondary | ICD-10-CM | POA: Diagnosis not present

## 2018-08-18 MED ORDER — NITROFURANTOIN MONOHYD MACRO 100 MG PO CAPS: 100.0000 mg | ORAL_CAPSULE | Freq: Two times a day (BID) | ORAL | 0 refills | Status: DC

## 2018-08-18 NOTE — Progress Notes (Signed)

## 2018-10-18 ENCOUNTER — Telehealth: Payer: 59 | Admitting: Family

## 2018-10-18 DIAGNOSIS — B009 Herpesviral infection, unspecified: Secondary | ICD-10-CM

## 2018-10-18 MED ORDER — VALACYCLOVIR HCL 1 G PO TABS: 2000.0000 mg | ORAL_TABLET | Freq: Two times a day (BID) | ORAL | 0 refills | Status: DC

## 2018-10-18 MED FILL — valACYclovir HCL 1 GM TABS: 1 | 8 days supply | Qty: 8 | Fill #0

## 2018-10-18 NOTE — Progress Notes (Signed)
Greater than 5 minutes, yet less than 10 minutes of time have been spent researching, coordinating, and implementing care for this patient today.  Thank you for the details you included in the comment boxes. Those details are very helpful in determining the best course of treatment for you and help [place] to provide the best care.  We are sorry that you are not feeling well.  Here is how we plan to help!  Based on what you have shared with me it does look like you have a viral infection.    Most cold sores or fever blisters are small fluid filled blisters around the mouth caused by herpes simplex virus.  The most common strain of the virus causing cold sores is herpes simplex virus 1.  It can be spread by skin contact, sharing eating utensils, or even sharing towels.  Cold sores are contagious to other people until dry. (Approximately 5-7 days).  Wash your hands. You can spread the virus to your eyes through handling your contact lenses after touching the lesions.  Most people experience pain at the sight or tingling sensations in their lips that may begin before the ulcers erupt.  Herpes simplex is treatable but not curable.  It may lie dormant for a long time and then reappear due to stress or prolonged sun exposure.  Many patients have success in treating their cold sores with an over the counter topical called Abreva.  You may apply the cream up to 5 times daily (maximum 10 days) until healing occurs.  If you would like to use an oral antiviral medication to speed the healing of your cold sore, I have sent a prescription to your local pharmacy Valacyclovir 2 gm twice daily for 1 day    HOME CARE:   Wash your hands frequently.  Do not pick at or rub the sore.  Don't open the blisters.  Avoid kissing other people during this time.  Avoid sharing drinking glasses, eating utensils, or razors.  Do not handle contact lenses unless you have thoroughly washed your hands with soap and warm  water!  Avoid oral sex during this time.  Herpes from sores on your mouth can spread to your partner's genital area.  Avoid contact with anyone who has eczema or a weakened immune system.  Cold sores are often triggered by exposure to intense sunlight, use a lip balm containing a sunscreen (SPF 30 or higher).  GET HELP RIGHT AWAY IF:   Blisters look infected.  Blisters occur near or in the eye.  Symptoms last longer than 10 days.  Your symptoms become worse.  MAKE SURE YOU:   Understand these instructions.  Will watch your condition.  Will get help right away if you are not doing well or get worse.    Your e-visit answers were reviewed by a board certified advanced clinical practitioner to complete your personal care plan.  Depending upon the condition, your plan could have  Included both over the counter or prescription medications.    Please review your pharmacy choice.  Be sure that the pharmacy you have chosen is open so that you can pick up your prescription now.  If there is a problem you can message your provider in [place] to have the prescription routed to another pharmacy.    Your safety is important to [place].  If you have drug allergies check our prescription carefully.  For the next 24 hours you can use MyChart to ask questions about today's visit, request  a non-urgent call back, or ask for a work or school excuse from your e-visit provider.  You will get an email in the next two days asking about your experience.  I hope that your e-visit has been valuable and will speed your recovery.

## 2018-10-21 MED FILL — ACYCLOVIR 400 MG TABLET: 400 | 15 days supply | Qty: 30 | Fill #1

## 2018-11-15 DIAGNOSIS — H2513 Age-related nuclear cataract, bilateral: Secondary | ICD-10-CM | POA: Diagnosis not present

## 2018-12-19 DIAGNOSIS — H5032 Intermittent alternating esotropia: Secondary | ICD-10-CM | POA: Diagnosis not present

## 2019-03-04 ENCOUNTER — Telehealth: Payer: 59 | Admitting: Nurse Practitioner

## 2019-03-04 DIAGNOSIS — B009 Herpesviral infection, unspecified: Secondary | ICD-10-CM

## 2019-03-04 MED ORDER — VALACYCLOVIR HCL 1 G PO TABS: 2000.0000 mg | ORAL_TABLET | Freq: Two times a day (BID) | ORAL | 0 refills | Status: DC

## 2019-03-04 NOTE — Progress Notes (Signed)
We are sorry that you are not feeling well.  Here is how we plan to help!  Based on what you have shared with me it does look like you have a viral infection.    Most cold sores or fever blisters are small fluid filled blisters around the mouth caused by herpes simplex virus.  The most common strain of the virus causing cold sores is herpes simplex virus 1.  It can be spread by skin contact, sharing eating utensils, or even sharing towels.  Cold sores are contagious to other people until dry. (Approximately 5-7 days).  Wash your hands. You can spread the virus to your eyes through handling your contact lenses after touching the lesions.  Most people experience pain at the sight or tingling sensations in their lips that may begin before the ulcers erupt.  Herpes simplex is treatable but not curable.  It may lie dormant for a long time and then reappear due to stress or prolonged sun exposure.  Many patients have success in treating their cold sores with an over the counter topical called Abreva.  You may apply the cream up to 5 times daily (maximum 10 days) until healing occurs.  If you would like to use an oral antiviral medication to speed the healing of your cold sore, I have sent a prescription to your local pharmacy Valacyclovir 2 gm twice daily for 1 day    HOME CARE:   Wash your hands frequently.  Do not pick at or rub the sore.  Don't open the blisters.  Avoid kissing other people during this time.  Avoid sharing drinking glasses, eating utensils, or razors.  Do not handle contact lenses unless you have thoroughly washed your hands with soap and warm water!  Avoid oral sex during this time.  Herpes from sores on your mouth can spread to your partner's genital area.  Avoid contact with anyone who has eczema or a weakened immune system.  Cold sores are often triggered by exposure to intense sunlight, use a lip balm containing a sunscreen (SPF 30 or higher).  GET HELP RIGHT AWAY  IF:   Blisters look infected.  Blisters occur near or in the eye.  Symptoms last longer than 10 days.  Your symptoms become worse.  MAKE SURE YOU:   Understand these instructions.  Will watch your condition.  Will get help right away if you are not doing well or get worse.    Your e-visit answers were reviewed by a board certified advanced clinical practitioner to complete your personal care plan.  Depending upon the condition, your plan could have  Included both over the counter or prescription medications.    Please review your pharmacy choice.  Be sure that the pharmacy you have chosen is open so that you can pick up your prescription now.  If there is a problem you can message your provider in MyChart to have the prescription routed to another pharmacy.    Your safety is important to us.  If you have drug allergies check our prescription carefully.  For the next 24 hours you can use MyChart to ask questions about today's visit, request a non-urgent call back, or ask for a work or school excuse from your e-visit provider.  You will get an email in the next two days asking about your experience.  I hope that your e-visit has been valuable and will speed your recovery.  5-10 minutes spent reviewing and documenting in chart.  

## 2019-03-12 MED FILL — valACYclovir HCL 1 GM TABS: 1 | 2 days supply | Qty: 8 | Fill #0

## 2019-03-18 ENCOUNTER — Other Ambulatory Visit: Payer: Self-pay

## 2019-03-18 ENCOUNTER — Emergency Department (HOSPITAL_COMMUNITY)
Admission: EM | Admit: 2019-03-18 | Discharge: 2019-03-18 | Disposition: A | Discharge status: Home / Self Care | Payer: PRIVATE HEALTH INSURANCE | Attending: Emergency Medicine | Admitting: Emergency Medicine

## 2019-03-18 ENCOUNTER — Encounter (HOSPITAL_COMMUNITY): Payer: Self-pay | Admitting: Emergency Medicine

## 2019-03-18 DIAGNOSIS — Z87891 Personal history of nicotine dependence: Secondary | ICD-10-CM | POA: Insufficient documentation

## 2019-03-18 DIAGNOSIS — Y92530 Ambulatory surgery center as the place of occurrence of the external cause: Secondary | ICD-10-CM | POA: Insufficient documentation

## 2019-03-18 DIAGNOSIS — Y9389 Activity, other specified: Secondary | ICD-10-CM | POA: Diagnosis not present

## 2019-03-18 DIAGNOSIS — Y990 Civilian activity done for income or pay: Secondary | ICD-10-CM | POA: Insufficient documentation

## 2019-03-18 DIAGNOSIS — S299XXA Unspecified injury of thorax, initial encounter: Secondary | ICD-10-CM | POA: Diagnosis present

## 2019-03-18 DIAGNOSIS — X500XXA Overexertion from strenuous movement or load, initial encounter: Secondary | ICD-10-CM | POA: Insufficient documentation

## 2019-03-18 DIAGNOSIS — S29012A Strain of muscle and tendon of back wall of thorax, initial encounter: Secondary | ICD-10-CM | POA: Diagnosis not present

## 2019-03-18 DIAGNOSIS — Z79899 Other long term (current) drug therapy: Secondary | ICD-10-CM | POA: Diagnosis not present

## 2019-03-18 DIAGNOSIS — T148XXA Other injury of unspecified body region, initial encounter: Secondary | ICD-10-CM

## 2019-03-18 MED ORDER — METHOCARBAMOL 500 MG PO TABS: 500.0000 mg | ORAL_TABLET | Freq: Three times a day (TID) | ORAL | 0 refills | Status: DC

## 2019-03-18 MED ORDER — DICLOFENAC SODIUM 75 MG PO TBEC: 75.0000 mg | DELAYED_RELEASE_TABLET | Freq: Two times a day (BID) | ORAL | 0 refills | Status: DC

## 2019-03-18 MED ORDER — KETOROLAC TROMETHAMINE 60 MG/2ML IM SOLN
60.0000 mg | Freq: Once | INTRAMUSCULAR | Status: AC
  Administered 2019-03-18: 60 mg via INTRAMUSCULAR
  Filled 2019-03-18: qty 2

## 2019-03-18 NOTE — ED Provider Notes (Signed)
[facility] EMERGENCY DEPARTMENT Provider Note   CSN: [id number] Arrival date & time: 03/18/19  1057     History   Chief Complaint Chief Complaint  Patient presents with  . Back Pain    HPI Christine Marquez is a 58 y.o. female.     HPI   Christine Marquez is a 58 y.o. female who presents to the Emergency Department complaining of right upper back pain of sudden onset this morning.  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  She states that she was pulling up the patient in the bed when she felt a sudden sharp pain along the lower portion of her right shoulder blade.  At onset, pain was sharp but seem to be improving until she helped another patient move and the pain became worse.  Pain is reproduced with movement of her right arm twisting and deep breathing.  Pain improves somewhat at rest.  She has applied ice and taken Tylenol and ibuprofen with minimal relief.  She denies any pain radiating to her neck, arm, or chest.  No numbness or tingling.  No history of trauma.   No past medical history on file.  There are no active problems to display for this patient.   Past Surgical History:  Procedure Laterality Date  . ABDOMINAL HYSTERECTOMY    . HEMORRHOID SURGERY       OB History    Gravida  2   Para  2   Term  2   Preterm      AB      Living        SAB      TAB      Ectopic      Multiple      Live Births               Home Medications    Prior to Admission medications   Medication Sig Start Date End Date Taking? Authorizing Provider  acetaminophen (TYLENOL) 325 MG tablet Take 650 mg by mouth every 6 (six) hours as needed for moderate pain.    [provider]  acyclovir (ZOVIRAX) 400 MG tablet Take 2 tablets (800 mg total) by mouth 2 (two) times daily. 09/24/16   Withrow, Elyse Jarvis, FNP  nitrofurantoin, macrocrystal-monohydrate, (MACROBID) 100 MG capsule Take 1 capsule (100 mg total) by mouth 2 (two) times daily. 08/18/18   Kennyth Arnold,  FNP  ondansetron (ZOFRAN ODT) 4 MG disintegrating tablet Take 1 tablet (4 mg total) by mouth every 8 (eight) hours as needed for nausea or vomiting. 01/07/18   Dutch Quint B, FNP  valACYclovir (VALTREX) 1000 MG tablet Take 2 tablets (2,000 mg total) by mouth 2 (two) times daily. 1 day only; may use the rest for another future oubreak. 03/04/19   Chevis Pretty, FNP    Family History Family History  Problem Relation Age of Onset  . Heart failure Father   . Cancer Other   . Diabetes Other     Social History Social History   Tobacco Use  . Smoking status: Former Smoker    Types: Cigarettes    Quit date: 05/22/2013    Years since quitting: 5.8  . Smokeless tobacco: Never Used  Substance Use Topics  . Alcohol use: No  . Drug use: No     Allergies   Penicillins   Review of Systems Review of Systems  Constitutional: Negative for chills and fever.  Respiratory: Negative for chest tightness  and shortness of breath.   Cardiovascular: Negative for chest pain.  Gastrointestinal: Negative for abdominal pain, nausea and vomiting.  Genitourinary: Negative for difficulty urinating and dysuria.  Musculoskeletal: Positive for back pain (Right upper back pain). Negative for arthralgias and neck pain.  Skin: Negative for color change, rash and wound.  Neurological: Negative for dizziness, syncope, weakness, numbness and headaches.     Physical Exam Updated Vital Signs BP 133/69   Pulse 84   Temp 98.3 F (36.8 C)   Resp 18   Ht 5\' 7"  (1.702 m)   Wt 63.5 kg   SpO2 100%   BMI 21.93 kg/m   Physical Exam Vitals signs and nursing note reviewed.  Constitutional:      General: She is not in acute distress.    Appearance: Normal appearance. She is not toxic-appearing.  HENT:     Head: Atraumatic.  Neck:     Musculoskeletal: Normal range of motion. No muscular tenderness.  Cardiovascular:     Rate and Rhythm: Normal rate and regular rhythm.     Pulses: Normal pulses.   Pulmonary:     Effort: Pulmonary effort is normal. No respiratory distress.     Breath sounds: Normal breath sounds.  Chest:     Chest wall: No tenderness.  Musculoskeletal:        General: Tenderness and signs of injury present. No swelling.     Comments: Tenderness to palpation along the lower right scapular border.  No midline tenderness or bony tenderness.  Pain reproduced with abduction of the right arm.  Grip strengths are strong and symmetrical.  No lumbar tenderness  Skin:    General: Skin is warm.     Findings: No rash.  Neurological:     General: No focal deficit present.     Mental Status: She is alert.     Sensory: No sensory deficit.     Motor: No weakness.      ED Treatments / Results  Labs (all labs ordered are listed, but only abnormal results are displayed) Labs Reviewed - No data to display  EKG None  Radiology No results found.  Procedures Procedures (including critical care time)  Medications Ordered in ED Medications  ketorolac (TORADOL) injection 60 mg (has no administration in time range)     Initial Impression / Assessment and Plan / ED Course  I have reviewed the triage vital signs and the nursing notes.  Pertinent labs & imaging results that were available during my care of the patient were reviewed by me and considered in my medical decision making (see chart for details).        Patient with likely musculoskeletal strain from moving a patient.  No focal neuro deficits on exam.  Vital signs reviewed.  No concerning symptoms for cardiac process.  No history of trauma to indicate need for imaging at this time.  Patient agrees to symptomatic treatment with NSAID and muscle relaxer.  She appears appropriate for discharge home, return precautions discussed.  Final Clinical Impressions(s) / ED Diagnoses   Final diagnoses:  Muscle strain    ED Discharge Orders    None       Kem Parkinson, PA-C 03/18/19 1149    Nat Christen, MD  03/20/19 1015

## 2019-03-18 NOTE — ED Triage Notes (Signed)
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

## 2019-09-04 ENCOUNTER — Ambulatory Visit: Payer: 59 | Admitting: Family Medicine

## 2019-09-19 ENCOUNTER — Ambulatory Visit (INDEPENDENT_AMBULATORY_CARE_PROVIDER_SITE_OTHER): Payer: 59 | Admitting: Family Medicine

## 2019-09-19 ENCOUNTER — Other Ambulatory Visit: Payer: Self-pay

## 2019-09-19 ENCOUNTER — Encounter: Payer: Self-pay | Admitting: Family Medicine

## 2019-09-19 VITALS — BP 124/78 | HR 79 | Temp 98.0°F | Ht 67.0 in | Wt 134.2 lb

## 2019-09-19 DIAGNOSIS — R21 Rash and other nonspecific skin eruption: Secondary | ICD-10-CM

## 2019-09-19 DIAGNOSIS — B009 Herpesviral infection, unspecified: Secondary | ICD-10-CM | POA: Diagnosis not present

## 2019-09-19 DIAGNOSIS — F5101 Primary insomnia: Secondary | ICD-10-CM | POA: Insufficient documentation

## 2019-09-19 DIAGNOSIS — R030 Elevated blood-pressure reading, without diagnosis of hypertension: Secondary | ICD-10-CM | POA: Insufficient documentation

## 2019-09-19 MED ORDER — ESZOPICLONE 1 MG PO TABS: 1.0000 mg | ORAL_TABLET | Freq: Every evening | ORAL | 0 refills | Status: DC | PRN

## 2019-09-19 MED ORDER — VALACYCLOVIR HCL 1 G PO TABS: 2000.0000 mg | ORAL_TABLET | Freq: Two times a day (BID) | ORAL | 0 refills | Status: DC

## 2019-09-19 NOTE — Progress Notes (Signed)
Subjective:  Patient ID: Christine Marquez, female    DOB: 12-Jun-1960  Age: 59 y.o. MRN: [id number]  CC:  Chief Complaint  Patient presents with  . New Patient (Initial Visit)    new pt dr Luan Pulling pt would like a dermatology referral she has acne like on her face       HPI  Christine Marquez is a 59 year old female patient who presents today to establish care.  Previous patient of Dr. Luan Pulling.  Overall she reports that she is doing well she just has a few concerns.  She reports that she has had increased rash like acne on her face trouble sleeping which has been off and on for years, needs to get updated labs, also needs a refill of her Valtrex as she usually keeps that on hand to help with her cold sores.  She has a very limited history.  Insomnia Primary symptoms: fragmented sleep, difficulty falling asleep, frequent awakening, premature morning awakening.  The current episode started more than one year. The onset quality is undetermined. The problem occurs nightly. The problem is unchanged. The symptoms are aggravated by anxiety and emotional upset. How many beverages per day that contain caffeine: 2-3.  Types of beverages you drink: coffee. Nothing relieves the symptoms. Past treatments include medication (OTC meds without relief). The treatment provided no relief. Typical bedtime:  10-11 P.M..  How long after going to bed to you fall asleep: 30-60 minutes.   PMH includes: family stress or anxiety. Prior diagnostic workup includes:  No prior workup.   Lives with her youngest son who had a traumatic brain event when he was younger whom she takes care of. She reports that usually her anxiety and insomnia are related to how he is doing with his daily life.  Questionable as to whether or not he has bipolar disorder but he is currently on medications that are helping him and that is helping overall home life.  She works as a Marine scientist at [employer].  She does her 312's in a row.  She eats a  well-balanced diet.  She could drink more water she does drink coffee throughout the day usually when she is at work.  Much less when she is at home. She has been trying to do more weightbearing exercises and core strengthening exercises. She also has an older son and 3 grandchildren that live close by that she likes to take care of him visit with.  She helps out a lot with her 61-year-old grandson.  Outside of the concerns listed above she has no other complaints or questions today.  Today patient denies signs and symptoms of COVID 19 infection including fever, chills, cough, shortness of breath, and headache. Past Medical, Surgical, Social History, Allergies, and Medications have been Reviewed.   History reviewed. No pertinent past medical history.  Current Meds  Medication Sig  . acetaminophen (TYLENOL) 325 MG tablet Take 650 mg by mouth every 6 (six) hours as needed for moderate pain.  . valACYclovir (VALTREX) 1000 MG tablet Take 2 tablets (2,000 mg total) by mouth 2 (two) times daily. 1 day only; may use the rest for another future oubreak.  . [DISCONTINUED] acyclovir (ZOVIRAX) 400 MG tablet Take 2 tablets (800 mg total) by mouth 2 (two) times daily.  . [DISCONTINUED] diclofenac (VOLTAREN) 75 MG EC tablet Take 1 tablet (75 mg total) by mouth 2 (two) times daily. Take with food  . [DISCONTINUED] valACYclovir (VALTREX) 1000 MG tablet Take 2  tablets (2,000 mg total) by mouth 2 (two) times daily. 1 day only; may use the rest for another future oubreak.    ROS:  Review of Systems  Constitutional: Negative.   HENT: Negative.   Eyes: Negative.   Respiratory: Negative.   Cardiovascular: Negative.   Gastrointestinal: Negative.   Genitourinary: Negative.   Musculoskeletal: Negative.   Skin: Negative.   Neurological: Negative.   Endo/Heme/Allergies: Negative.   Psychiatric/Behavioral: The patient has insomnia.      Objective:   Today's Vitals: BP 124/78 (BP Location: Left Arm,  Patient Position: Sitting, Cuff Size: Normal)   Pulse 79   Temp 98 F (36.7 C) (Temporal)   Ht 5\' 7"  (1.702 m)   Wt 134 lb 3.2 oz (60.9 kg)   SpO2 98%   BMI 21.02 kg/m  Vitals with BMI 09/19/2019 03/18/2019 03/18/2019  Height 5\' 7"  - 5\' 7"   Weight 134 lbs 3 oz - 140 lbs  BMI [id number]  Systolic [id number] -  Diastolic 78 60 -  Pulse 79 84 -     Physical Exam Vitals and nursing note reviewed.  Constitutional:      Appearance: Normal appearance. She is well-developed, well-groomed and normal weight.  HENT:     Head: Normocephalic and atraumatic.     Right Ear: External ear normal.     Left Ear: External ear normal.     Nose: Nose normal.  Eyes:     General:        Right eye: No discharge.        Left eye: No discharge.     Conjunctiva/sclera: Conjunctivae normal.  Cardiovascular:     Rate and Rhythm: Normal rate and regular rhythm.     Pulses: Normal pulses.     Heart sounds: Normal heart sounds.  Pulmonary:     Effort: Pulmonary effort is normal.     Breath sounds: Normal breath sounds.  Musculoskeletal:        General: Normal range of motion.     Cervical back: Normal range of motion and neck supple.  Skin:    General: Skin is warm.     Findings: Rash present.     Comments: Multiple small bumps presented across the jaw, cheek and nose.  She does present with some redness in that area as well questionable rosacea.  Has never been diagnosed with anything in the past.  Neurological:     General: No focal deficit present.     Mental Status: She is alert and oriented to person, place, and time.  Psychiatric:        Attention and Perception: Attention and perception normal.        Mood and Affect: Mood and affect normal.        Speech: Speech normal.        Behavior: Behavior normal. Behavior is cooperative.        Thought Content: Thought content normal.        Cognition and Memory: Cognition and memory normal.        Judgment: Judgment normal.     Comments: Very  pleasant in communication.     Assessment   1. Primary insomnia   2. HSV (herpes simplex virus) infection   3. Rash of face   4. Prehypertension     Tests ordered Orders Placed This Encounter  Procedures  . CBC  . COMPLETE METABOLIC PANEL WITH GFR  . Ambulatory referral to Dermatology     Plan:  Please see assessment and plan per problem list above.   Meds ordered this encounter  Medications  . valACYclovir (VALTREX) 1000 MG tablet    Sig: Take 2 tablets (2,000 mg total) by mouth 2 (two) times daily. 1 day only; may use the rest for another future oubreak.    Dispense:  8 tablet    Refill:  0    Order Specific Question:   Supervising Provider    Answer:   SIMPSON, MARGARET E [id number]  . eszopiclone (LUNESTA) 1 MG TABS tablet    Sig: Take 1 tablet (1 mg total) by mouth at bedtime as needed for sleep. Take immediately before bedtime    Dispense:  30 tablet    Refill:  0    Order Specific Question:   Supervising Provider    Answer:   Jacklynn Bue    Patient to follow-up in 12/19/2019   Perlie Mayo, NP

## 2019-09-19 NOTE — Assessment & Plan Note (Signed)
Has no current breakouts at this time.  Have provided Valtrex to keep on hand.  She is aware of when to start the medication and how to take it appropriately.

## 2019-09-19 NOTE — Patient Instructions (Signed)
I appreciate the opportunity to provide you with care for your health and wellness. Today we discussed: established care  Follow up: 3 months for annual   Labs today-we will always call you with results, even if normal Referral today to Dermatology  It was nice to meet you today :) Hope you can sleep better soon!  Please continue to practice social distancing to keep you, your family, and our community safe.  If you must go out, please wear a mask and practice good handwashing.  It was a pleasure to see you and I look forward to continuing to work together on your health and well-being. Please do not hesitate to call the office if you need care or have questions about your care.  Have a wonderful day and week. With Gratitude, Cherly Beach, DNP, AGNP-BC

## 2019-09-19 NOTE — Assessment & Plan Note (Signed)
Overall well controlled, will get updated labs to check kidney function as well.  Encouraged her to continue her diet and lifestyle changes that she has implemented.

## 2019-09-19 NOTE — Assessment & Plan Note (Signed)
Rash on face questionable rosacea or some other form of acne, referral to dermatology today.

## 2019-09-19 NOTE — Assessment & Plan Note (Signed)
Insomnia is variable at times.  But can get worse especially when she is dealing with her son.  She has tried multiple over-the-counter remedies without success.  Is willing to try Lunesta as needed.  I have advised that we try not to keep somebody on the sleep medication if able.  We will do 1 month trial to see how she is doing possible change to Atarax if needed.

## 2019-09-20 LAB — COMPLETE METABOLIC PANEL WITH GFR
AG Ratio: 2.1 (calc) (ref 1.0–2.5)
ALT: 10 U/L (ref 6–29)
AST: 18 U/L (ref 10–35)
Albumin: 4.6 g/dL (ref 3.6–5.1)
Alkaline phosphatase (APISO): 92 U/L (ref 37–153)
BUN: 12 mg/dL (ref 7–25)
CO2: 26 mmol/L (ref 20–32)
Calcium: 9.6 mg/dL (ref 8.6–10.4)
Chloride: 106 mmol/L (ref 98–110)
Creat: 0.74 mg/dL (ref 0.50–0.99)
GFR, Est African American: 101 mL/min/{1.73_m2} (ref >=60)
GFR, Est Non African American: 87 mL/min/{1.73_m2} (ref >=60)
Globulin: 2.2 g/dL (calc) (ref 1.9–3.7)
Glucose, Bld: 86 mg/dL (ref 65–99)
Potassium: 4.6 mmol/L (ref 3.5–5.3)
Sodium: 143 mmol/L (ref 135–146)
Total Bilirubin: 0.5 mg/dL (ref 0.2–1.2)
Total Protein: 6.8 g/dL (ref 6.1–8.1)

## 2019-09-20 LAB — CBC
HCT: 40.4 % (ref 35.0–45.0)
Hemoglobin: 13.7 g/dL (ref 11.7–15.5)
MCH: 32.5 pg (ref 27.0–33.0)
MCHC: 33.9 g/dL (ref 32.0–36.0)
MCV: 95.7 fL (ref 80.0–100.0)
MPV: 11.2 fL (ref 7.5–12.5)
Platelets: 205 10*3/uL (ref 140–400)
RBC: 4.22 10*6/uL (ref 3.80–5.10)
RDW: 12 % (ref 11.0–15.0)
WBC: 4.2 10*3/uL (ref 3.8–10.8)

## 2019-09-25 ENCOUNTER — Ambulatory Visit: Payer: 59 | Admitting: Family Medicine

## 2019-09-26 DIAGNOSIS — L718 Other rosacea: Secondary | ICD-10-CM | POA: Diagnosis not present

## 2019-09-26 MED FILL — DOXYCYCLINE MONO 100 MG CAP: 100 | 30 days supply | Qty: 30 | Fill #0

## 2019-10-05 ENCOUNTER — Encounter: Payer: Self-pay | Admitting: Family Medicine

## 2019-10-06 MED FILL — valACYclovir HCL 1 GM TABS: 1 | 2 days supply | Qty: 8 | Fill #0

## 2019-10-06 MED FILL — valACYclovir HCL 1 GM TABS: 1 | 2 days supply | Qty: 8 | Fill #0 | Status: TO

## 2019-10-24 MED FILL — metroNIDAZOLE 0.75 % CREA: 0.75 | 20 days supply | Qty: 45 | Fill #0

## 2019-10-24 MED FILL — DOXYCYCLINE MONO 100 MG TAB: 100 | 30 days supply | Qty: 30 | Fill #0

## 2019-11-19 ENCOUNTER — Ambulatory Visit (INDEPENDENT_AMBULATORY_CARE_PROVIDER_SITE_OTHER): Payer: 59 | Admitting: Family Medicine

## 2019-11-19 ENCOUNTER — Other Ambulatory Visit: Payer: Self-pay

## 2019-11-19 ENCOUNTER — Encounter: Payer: Self-pay | Admitting: Family Medicine

## 2019-11-19 VITALS — BP 119/70 | HR 80 | Temp 97.2°F | Resp 18 | Ht 67.0 in | Wt 131.8 lb

## 2019-11-19 DIAGNOSIS — F5101 Primary insomnia: Secondary | ICD-10-CM | POA: Diagnosis not present

## 2019-11-19 DIAGNOSIS — Z1231 Encounter for screening mammogram for malignant neoplasm of breast: Secondary | ICD-10-CM

## 2019-11-19 DIAGNOSIS — R030 Elevated blood-pressure reading, without diagnosis of hypertension: Secondary | ICD-10-CM | POA: Diagnosis not present

## 2019-11-19 DIAGNOSIS — B009 Herpesviral infection, unspecified: Secondary | ICD-10-CM | POA: Diagnosis not present

## 2019-11-19 DIAGNOSIS — Z0001 Encounter for general adult medical examination with abnormal findings: Secondary | ICD-10-CM | POA: Diagnosis not present

## 2019-11-19 MED ORDER — VALACYCLOVIR HCL 1 G PO TABS: 2000.0000 mg | ORAL_TABLET | Freq: Two times a day (BID) | ORAL | 0 refills | Status: DC

## 2019-11-19 MED ORDER — ESZOPICLONE 1 MG PO TABS: 1.0000 mg | ORAL_TABLET | Freq: Every evening | ORAL | 0 refills | Status: DC | PRN

## 2019-11-19 MED FILL — valACYclovir HCL 1 GM TABS: 1 | 2 days supply | Qty: 8 | Fill #0

## 2019-11-19 MED FILL — ESZOPICLONE 1 MG TABS: 1 | 30 days supply | Qty: 30 | Fill #0

## 2019-11-19 NOTE — Assessment & Plan Note (Signed)
Discussed monthly self breast exams and yearly mammograms; at least 30 minutes of aerobic activity at least 5 days/week and weight-bearing exercise 2x/week; proper sunscreen use reviewed; healthy diet, including goals of calcium and vitamin D intake and alcohol recommendations (less than or equal to 1 drink/day) reviewed; regular seatbelt use; changing batteries in smoke detectors.  Immunization recommendations discussed.  Colonoscopy recommendations reviewed.  

## 2019-11-19 NOTE — Progress Notes (Signed)
Health Maintenance reviewed -    There is no immunization history on file for this patient. Last Pap smear: 5 years ago Last mammogram: 10/2019 Last colonoscopy: Had cologuard-negative  Last DEXA: n/a Dentist: twice yearly Ophtho: yearly  Exercise: daily  Smoker: no  Alcohol Use: social  Other doctors caring for patient include:  Patient Care Team: Perlie Mayo, NP as PCP - General (Family Medicine)  End of Life Discussion:  Patient does not have a living will and medical power of attorney  Subjective:   HPI  Christine Marquez is a 59 y.o. female who presents for annual wellness visit and follow-up on chronic medical conditions.  She has the following concerns: none, outside refills.  She reports that she is sleeping better with the use of Lunesta.  Refill as needed.  She denies having any problems chewing, swallowing or eating or appetite changes.  She reports she has been eating better.  She reports no changes in bowel or bladder habits.  Denies having any blood in urine or stool.  Denies having any changes in her memory.  Denies having any falls.  Reports that the rash that was on her skin is much better and improved with the use of the dermatologist treatment.  She denies having any chest pain, palpitations, leg swelling, dizziness, headaches, vision changes, shortness of breath, cough fevers or chills.  Review Of Systems  Review of Systems  HENT: Negative.   Eyes: Negative.   Respiratory: Negative.   Cardiovascular: Negative.   Gastrointestinal: Negative.   Endocrine: Negative.   Genitourinary: Negative.   Musculoskeletal: Negative.   Skin: Negative.   Allergic/Immunologic: Negative.   Neurological: Negative.   Hematological: Negative.   Psychiatric/Behavioral: Negative.   All other systems reviewed and are negative.   Objective:   PHYSICAL EXAM:  BP 119/70 (BP Location: Right Arm, Patient Position: Sitting, Cuff Size: Normal)   Pulse 80   Temp (!)  97.2 F (36.2 C) (Temporal)   Resp 18   Ht 5\' 7"  (1.702 m)   Wt 131 lb 12.8 oz (59.8 kg)   SpO2 95%   BMI 20.64 kg/m    Physical Exam Vitals and nursing note reviewed.  Constitutional:      Appearance: Normal appearance. She is well-developed, well-groomed and normal weight.  HENT:     Head: Normocephalic and atraumatic.     Right Ear: Hearing, tympanic membrane, ear canal and external ear normal.     Left Ear: Hearing, tympanic membrane, ear canal and external ear normal.     Nose: Nose normal.     Mouth/Throat:     Lips: Pink.     Mouth: Mucous membranes are moist.     Pharynx: Oropharynx is clear. Uvula midline.  Eyes:     General: Lids are normal.     Extraocular Movements: Extraocular movements intact.     Conjunctiva/sclera: Conjunctivae normal.     Pupils: Pupils are equal, round, and reactive to light.  Neck:     Thyroid: No thyroid mass, thyromegaly or thyroid tenderness.     Vascular: No carotid bruit.  Cardiovascular:     Rate and Rhythm: Normal rate and regular rhythm.     Pulses: Normal pulses.          Radial pulses are 2+ on the right side and 2+ on the left side.       Dorsalis pedis pulses are 2+ on the right side and 2+ on the left side.  Heart sounds: Normal heart sounds.  Pulmonary:     Effort: Pulmonary effort is normal.     Breath sounds: Normal breath sounds and air entry.  Abdominal:     General: Abdomen is flat. Bowel sounds are normal.     Palpations: Abdomen is soft.     Tenderness: There is no abdominal tenderness. There is no right CVA tenderness or left CVA tenderness.  Musculoskeletal:        General: Normal range of motion.     Cervical back: Full passive range of motion without pain, normal range of motion and neck supple.     Right lower leg: No edema.     Left lower leg: No edema.     Comments: MAE, ROM intact   Lymphadenopathy:     Cervical: No cervical adenopathy.  Skin:    General: Skin is warm and dry.     Capillary  Refill: Capillary refill takes less than 2 seconds.  Neurological:     General: No focal deficit present.     Mental Status: She is alert and oriented to person, place, and time. Mental status is at baseline.     Cranial Nerves: Cranial nerves are intact.     Sensory: Sensation is intact.     Motor: Motor function is intact.     Coordination: Coordination is intact.     Gait: Gait is intact.  Psychiatric:        Attention and Perception: Attention and perception normal.        Mood and Affect: Mood and affect normal.        Speech: Speech normal.        Behavior: Behavior normal. Behavior is cooperative.        Thought Content: Thought content normal.        Cognition and Memory: Cognition and memory normal.        Judgment: Judgment normal.     Comments: Good eye contact and communication        Depression Screening  Depression screen [facility] 2/9 11/19/2019 09/19/2019  Decreased Interest 0 0  Down, Depressed, Hopeless 0 0  PHQ - 2 Score 0 0  Altered sleeping - 3  Tired, decreased energy - 0  Change in appetite - 0  Feeling bad or failure about yourself  - 0  Trouble concentrating - 0  Moving slowly or fidgety/restless - 0  PHQ-9 Score - 3  Difficult doing work/chores - Not difficult at all     Falls  Fall Risk  11/19/2019 09/19/2019  Falls in the past year? 0 0  Number falls in past yr: 0 0  Injury with Fall? 0 0  Risk for fall due to : No Fall Risks No Fall Risks  Follow up Falls evaluation completed Falls evaluation completed    Assessment & Plan:   1. Annual visit for general adult medical examination with abnormal findings   2. Prehypertension   3. Primary insomnia   4. HSV (herpes simplex virus) infection   5. Encounter for screening mammogram for malignant neoplasm of breast     Tests ordered Orders Placed This Encounter  Procedures  . MM 3D SCREEN BREAST BILATERAL  . CBC  . COMPLETE METABOLIC PANEL WITH GFR  . Lipid panel  . Hemoglobin A1c  . TSH      Plan: Please see assessment and plan per problem list above.   Meds ordered this encounter  Medications  . valACYclovir (VALTREX) 1000 MG tablet  Sig: Take 2 tablets (2,000 mg total) by mouth 2 (two) times daily. 1 day only; may use the rest for another future oubreak.    Dispense:  8 tablet    Refill:  0    Order Specific Question:   Supervising Provider    Answer:   SIMPSON, MARGARET E [[id number]]  . eszopiclone (LUNESTA) 1 MG TABS tablet    Sig: Take 1 tablet (1 mg total) by mouth at bedtime as needed for sleep. Take immediately before bedtime    Dispense:  30 tablet    Refill:  0    Order Specific Question:   Supervising Provider    Answer:   Tula Nakayama E [[id number]]    I have personally reviewed: The patient's medical and social history Their use of alcohol, tobacco or illicit drugs Their current medications and supplements The patient's functional ability including ADLs,fall risks, home safety risks, cognitive, and hearing and visual impairment Diet and physical activities Evidence for depression or mood disorders  The patient's weight, height, BMI, and visual acuity have been recorded in the chart.  I have made referrals, counseling, and provided education to the patient based on review of the above and I have provided the patient with a written personalized care plan for preventive services.    Note: This dictation was prepared with Dragon dictation along with smaller phrase technology. Similar sounding words can be transcribed inadequately or may not be corrected upon review. Any transcriptional errors that result from this process are unintentional.      Perlie Mayo, NP   11/19/2019

## 2019-11-19 NOTE — Assessment & Plan Note (Signed)
No current breakouts, will get going on vacation, refill provided for this. She is aware of when to start the medication.

## 2019-11-19 NOTE — Assessment & Plan Note (Signed)
Improved with the use of Lunesta PRN. Refill provided

## 2019-11-19 NOTE — Assessment & Plan Note (Signed)
Improved from previous visit. Updated labs ordered. DASH diet and exercise encouraged to continue

## 2019-11-19 NOTE — Patient Instructions (Signed)
I appreciate the opportunity to provide you with care for your health and wellness. Today we discussed: overall health   Follow up: 6 months follow up   Labs-fasting today Referrals none Orders: Mammogram  Have a great rest of the year!   Please continue to practice social distancing to keep you, your family, and our community safe.  If you must go out, please wear a mask and practice good handwashing.  It was a pleasure to see you and I look forward to continuing to work together on your health and well-being. Please do not hesitate to call the office if you need care or have questions about your care.  Have a wonderful day and week. With Gratitude, Cherly Beach, DNP, AGNP-BC  HEALTH MAINTENANCE RECOMMENDATIONS:  It is recommended that you get at least 30 minutes of aerobic exercise at least 5 days/week (for weight loss, you may need as much as 60-90 minutes). This can be any activity that gets your heart rate up. This can be divided in 10-15 minute intervals if needed, but try and build up your endurance at least once a week.  Weight bearing exercise is also recommended twice weekly.  Eat a healthy diet with lots of vegetables, fruits and fiber.  "Colorful" foods have a lot of vitamins (ie green vegetables, tomatoes, red peppers, etc).  Limit sweet tea, regular sodas and alcoholic beverages, all of which has a lot of calories and sugar.  Up to 1 alcoholic drink daily may be beneficial for women (unless trying to lose weight, watch sugars).  Drink a lot of water.  Calcium recommendations are 1200-1500 mg daily (1500 mg for postmenopausal women or women without ovaries), and vitamin D 1000 IU daily.  This should be obtained from diet and/or supplements (vitamins), and calcium should not be taken all at once, but in divided doses.  Monthly self breast exams and yearly mammograms for women over the age of 30 is recommended.  Sunscreen of at least SPF 30 should be used on all sun-exposed  parts of the skin when outside between the hours of 10 am and 4 pm (not just when at beach or pool, but even with exercise, golf, tennis, and yard work!)  Use a sunscreen that says "broad spectrum" so it covers both UVA and UVB rays, and make sure to reapply every 1-2 hours.  Remember to change the batteries in your smoke detectors when changing your clock times in the spring and fall.  Use your seat belt every time you are in a car, and please drive safely and not be distracted with cell phones and texting while driving.

## 2019-11-19 NOTE — Assessment & Plan Note (Signed)
Mammogram ordered today. No reports or breast lumps or changes per her.

## 2019-11-20 LAB — COMPLETE METABOLIC PANEL WITH GFR
AG Ratio: 1.9 (calc) (ref 1.0–2.5)
ALT: 16 U/L (ref 6–29)
AST: 21 U/L (ref 10–35)
Albumin: 4.8 g/dL (ref 3.6–5.1)
Alkaline phosphatase (APISO): 102 U/L (ref 37–153)
BUN: 18 mg/dL (ref 7–25)
CO2: 31 mmol/L (ref 20–32)
Calcium: 10 mg/dL (ref 8.6–10.4)
Chloride: 102 mmol/L (ref 98–110)
Creat: 0.73 mg/dL (ref 0.50–0.99)
GFR, Est African American: 103 mL/min/{1.73_m2} (ref >=60)
GFR, Est Non African American: 89 mL/min/{1.73_m2} (ref >=60)
Globulin: 2.5 g/dL (calc) (ref 1.9–3.7)
Glucose, Bld: 71 mg/dL (ref 65–99)
Potassium: 4.6 mmol/L (ref 3.5–5.3)
Sodium: 140 mmol/L (ref 135–146)
Total Bilirubin: 0.6 mg/dL (ref 0.2–1.2)
Total Protein: 7.3 g/dL (ref 6.1–8.1)

## 2019-11-20 LAB — HEMOGLOBIN A1C
Hgb A1c MFr Bld: 5 % of total Hgb (ref <5.7)
Mean Plasma Glucose: 97 (calc)
eAG (mmol/L): 5.4 (calc)

## 2019-11-20 LAB — LIPID PANEL
Cholesterol: 206 mg/dL — ABNORMAL HIGH (ref <200)
HDL: 94 mg/dL (ref >=50)
LDL Cholesterol (Calc): 98 mg/dL (calc)
Non-HDL Cholesterol (Calc): 112 mg/dL (calc) (ref <130)
Total CHOL/HDL Ratio: 2.2 (calc) (ref <5.0)
Triglycerides: 47 mg/dL (ref <150)

## 2019-11-20 LAB — CBC
HCT: 43.2 % (ref 35.0–45.0)
Hemoglobin: 14 g/dL (ref 11.7–15.5)
MCH: 31.7 pg (ref 27.0–33.0)
MCHC: 32.4 g/dL (ref 32.0–36.0)
MCV: 98 fL (ref 80.0–100.0)
MPV: 11.1 fL (ref 7.5–12.5)
Platelets: 231 10*3/uL (ref 140–400)
RBC: 4.41 10*6/uL (ref 3.80–5.10)
RDW: 12.4 % (ref 11.0–15.0)
WBC: 4.7 10*3/uL (ref 3.8–10.8)

## 2019-11-20 LAB — TSH: TSH: 0.53 mIU/L (ref 0.40–4.50)

## 2019-11-25 MED FILL — metroNIDAZOLE 0.75 % CREA: 0.75 | 20 days supply | Qty: 45 | Fill #1

## 2019-11-25 MED FILL — DOXYCYCLINE MONO 100 MG TAB: 100 | 30 days supply | Qty: 30 | Fill #1

## 2019-12-05 DIAGNOSIS — H2513 Age-related nuclear cataract, bilateral: Secondary | ICD-10-CM | POA: Diagnosis not present

## 2019-12-05 DIAGNOSIS — H47093 Other disorders of optic nerve, not elsewhere classified, bilateral: Secondary | ICD-10-CM | POA: Diagnosis not present

## 2019-12-05 DIAGNOSIS — H524 Presbyopia: Secondary | ICD-10-CM | POA: Diagnosis not present

## 2019-12-19 ENCOUNTER — Encounter: Payer: 59 | Admitting: Family Medicine

## 2020-01-01 ENCOUNTER — Other Ambulatory Visit (HOSPITAL_COMMUNITY): Payer: Self-pay | Admitting: Dermatology

## 2020-01-01 DIAGNOSIS — L718 Other rosacea: Secondary | ICD-10-CM | POA: Diagnosis not present

## 2020-01-01 DIAGNOSIS — L814 Other melanin hyperpigmentation: Secondary | ICD-10-CM | POA: Diagnosis not present

## 2020-01-01 MED FILL — metroNIDAZOLE 0.75 % CREA: 0.75 | 30 days supply | Qty: 45 | Fill #0

## 2020-01-01 MED FILL — DOXYCYCLINE HYC 50 MG CAP: 50 | 30 days supply | Qty: 30 | Fill #0

## 2020-02-18 MED FILL — metroNIDAZOLE 0.75 % CREA: 0.75 | 30 days supply | Qty: 45 | Fill #1

## 2020-02-18 MED FILL — DOXYCYCLINE HYC 50 MG CAP: 50 | 30 days supply | Qty: 30 | Fill #1

## 2020-04-24 ENCOUNTER — Encounter: Payer: Self-pay | Admitting: Family Medicine

## 2020-04-26 ENCOUNTER — Other Ambulatory Visit: Payer: Self-pay | Admitting: *Deleted

## 2020-04-26 ENCOUNTER — Other Ambulatory Visit (HOSPITAL_COMMUNITY): Payer: Self-pay | Admitting: Family Medicine

## 2020-04-26 DIAGNOSIS — B009 Herpesviral infection, unspecified: Secondary | ICD-10-CM

## 2020-04-26 MED ORDER — VALACYCLOVIR HCL 1 G PO TABS: 2000.0000 mg | ORAL_TABLET | Freq: Two times a day (BID) | ORAL | 0 refills | Status: DC

## 2020-04-26 MED FILL — DOXYCYCLINE HYC 50 MG CAP: 50 | 30 days supply | Qty: 30 | Fill #2

## 2020-04-26 MED FILL — valACYclovir HCL 1 GM TABS: 1 | 4 days supply | Qty: 8 | Fill #0

## 2020-04-26 MED FILL — metroNIDAZOLE 0.75 % CREA: 0.75 | 30 days supply | Qty: 45 | Fill #2

## 2020-04-26 NOTE — Telephone Encounter (Signed)
Pt valtrex sent to the pharmacy would you like for her to have an appointment for the new medication. Please advise?

## 2020-04-27 NOTE — Telephone Encounter (Signed)
Yes, she needs an appt to start any new medication.

## 2020-05-20 ENCOUNTER — Ambulatory Visit: Payer: 59 | Admitting: Family Medicine

## 2020-06-01 MED FILL — DOXYCYCLINE HYC 50 MG CAP: 50 | 30 days supply | Qty: 30 | Fill #3

## 2020-06-01 MED FILL — metroNIDAZOLE 0.75 % CREA: 0.75 | 30 days supply | Qty: 45 | Fill #3

## 2020-07-03 MED FILL — DOXYCYCLINE HYC 50 MG CAP: 50 | 30 days supply | Qty: 30 | Fill #4

## 2020-07-03 MED FILL — metroNIDAZOLE 0.75 % CREA: 0.75 | 30 days supply | Qty: 45 | Fill #4

## 2020-08-09 MED FILL — metroNIDAZOLE 0.75 % CREA: 0.75 | 30 days supply | Qty: 45 | Fill #5

## 2020-08-09 MED FILL — DOXYCYCLINE HYC 50 MG CAP: 50 | 30 days supply | Qty: 30 | Fill #5

## 2020-08-12 ENCOUNTER — Other Ambulatory Visit (HOSPITAL_BASED_OUTPATIENT_CLINIC_OR_DEPARTMENT_OTHER): Payer: Self-pay

## 2020-09-03 ENCOUNTER — Other Ambulatory Visit: Payer: Self-pay | Admitting: Family Medicine

## 2020-09-03 ENCOUNTER — Other Ambulatory Visit (HOSPITAL_COMMUNITY): Payer: Self-pay

## 2020-09-06 ENCOUNTER — Other Ambulatory Visit (HOSPITAL_COMMUNITY): Payer: Self-pay

## 2020-09-06 MED ORDER — VALACYCLOVIR HCL 1 G PO TABS
ORAL_TABLET | ORAL | 0 refills | Status: DC
  Filled 2020-09-06: qty 8, 2d supply, fill #0

## 2020-09-08 ENCOUNTER — Other Ambulatory Visit (HOSPITAL_COMMUNITY): Payer: Self-pay

## 2020-09-09 ENCOUNTER — Other Ambulatory Visit (HOSPITAL_COMMUNITY): Payer: Self-pay

## 2020-09-09 MED FILL — Doxycycline Hyclate Cap 50 MG: ORAL | 30 days supply | Qty: 30 | Fill #0 | Status: AC

## 2020-09-09 MED FILL — Metronidazole Cream 0.75%: CUTANEOUS | 30 days supply | Qty: 45 | Fill #0 | Status: AC

## 2020-09-10 ENCOUNTER — Other Ambulatory Visit (HOSPITAL_COMMUNITY): Payer: Self-pay

## 2020-10-14 ENCOUNTER — Other Ambulatory Visit (HOSPITAL_COMMUNITY): Payer: Self-pay

## 2020-10-14 MED FILL — Metronidazole Cream 0.75%: CUTANEOUS | 30 days supply | Qty: 45 | Fill #1 | Status: AC

## 2020-10-14 MED FILL — Doxycycline Hyclate Cap 50 MG: ORAL | 30 days supply | Qty: 30 | Fill #1 | Status: AC

## 2020-10-15 ENCOUNTER — Other Ambulatory Visit: Payer: Self-pay

## 2020-10-15 ENCOUNTER — Encounter: Payer: Self-pay | Admitting: Internal Medicine

## 2020-10-15 ENCOUNTER — Telehealth: Payer: 59 | Admitting: Internal Medicine

## 2020-10-15 VITALS — Temp 100.0°F

## 2020-10-15 DIAGNOSIS — U071 COVID-19: Secondary | ICD-10-CM | POA: Diagnosis not present

## 2020-10-15 MED ORDER — MOLNUPIRAVIR EUA 200MG CAPSULE: 4.0000 | ORAL_CAPSULE | Freq: Two times a day (BID) | ORAL | 0 refills | Status: AC

## 2020-10-15 NOTE — Progress Notes (Signed)
Virtual Visit via Telephone Note   This visit type was conducted due to national recommendations for restrictions regarding the COVID-19 Pandemic (e.g. social distancing) in an effort to limit this patient's exposure and mitigate transmission in our community.  Due to her co-morbid illnesses, this patient is at least at moderate risk for complications without adequate follow up.  This format is felt to be most appropriate for this patient at this time.  The patient did not have access to video technology/had technical difficulties with video requiring transitioning to audio format only (telephone).  All issues noted in this document were discussed and addressed.  No physical exam could be performed with this format.  Evaluation Performed:  Follow-up visit  Date:  10/15/2020   ID:  Christine Marquez, DOB June 18, 1960, MRN [id number]  Patient Location: Home Provider Location: Office/Clinic  Participants: Patient Location of Patient: Home Location of Provider: Telehealth Consent was obtain for visit to be over via telehealth. I verified that I am speaking with the correct person using two identifiers.  PCP:  Perlie Mayo, NP   Chief Complaint:  Cough and nasal congestion  History of Present Illness:    Christine Marquez is a 60 y.o. female who has a televisit for c/o cough, nasal congestion, sore throat and fever for last 2 days. She tested positive for COVID yesterday. She has had COVID vaccine. Denies any dyspnea or wheezing currently.  The patient does have symptoms concerning for COVID-19 infection (fever, chills, cough, or new shortness of breath).   Past Medical, Surgical, Social History, Allergies, and Medications have been Reviewed.  History reviewed. No pertinent past medical history. Past Surgical History:  Procedure Laterality Date  . ABDOMINAL HYSTERECTOMY    . HEMORRHOID SURGERY       Current Meds  Medication Sig  . acetaminophen (TYLENOL) 325 MG tablet Take 650 mg  by mouth every 6 (six) hours as needed for moderate pain.  Marland Kitchen doxycycline (VIBRAMYCIN) 50 MG capsule TAKE 1 CAPSULE BY MOUTH ONCE DAILY TAKE WITH FOOD AND WATER USE SUN PROTECTION  . eszopiclone (LUNESTA) 1 MG TABS tablet Take 1 tablet (1 mg total) by mouth at bedtime as needed for sleep. Take immediately before bedtime  . metroNIDAZOLE (METROCREAM) 0.75 % cream APPLY A DIME SIZED AMOUNT TO THE ENTIRE FACE TWO TIMES DAILY AS DIRECTED  . molnupiravir EUA 200 mg CAPS Take 4 capsules (800 mg total) by mouth 2 (two) times daily for 5 days.  . valACYclovir (VALTREX) 1000 MG tablet TAKE 2 TABLETS BY MOUTH 2 TIMES DAILY FOR 1 DAY     Allergies:   Penicillins   ROS:   Please see the history of present illness.     All other systems reviewed and are negative.   Labs/Other Tests and Data Reviewed:    Recent Labs: 11/19/2019: ALT 16; BUN 18; Creat 0.73; Hemoglobin 14.0; Platelets 231; Potassium 4.6; Sodium 140; TSH 0.53   Recent Lipid Panel Lab Results  Component Value Date/Time   CHOL 206 (H) 11/19/2019 11:51 AM   TRIG 47 11/19/2019 11:51 AM   HDL 94 11/19/2019 11:51 AM   CHOLHDL 2.2 11/19/2019 11:51 AM   LDLCALC 98 11/19/2019 11:51 AM    Wt Readings from Last 3 Encounters:  11/19/19 131 lb 12.8 oz (59.8 kg)  09/19/19 134 lb 3.2 oz (60.9 kg)  03/18/19 140 lb (63.5 kg)      ASSESSMENT & PLAN:    COVID-19 infection Started Molnupiravir considering mild-moderate  symptoms currently Robitussin PRN for cough Self-quarantine for 5-7 days or until 24-hour afebrile period, whichever is later  Time:   Today, I have spent 8 minutes reviewing the chart, including problem list, medications, and with the patient with telehealth technology discussing the above problems.   Medication Adjustments/Labs and Tests Ordered: Current medicines are reviewed at length with the patient today.  Concerns regarding medicines are outlined above.   Tests Ordered: No orders of the defined types were placed  in this encounter.   Medication Changes: Meds ordered this encounter  Medications  . molnupiravir EUA 200 mg CAPS    Sig: Take 4 capsules (800 mg total) by mouth 2 (two) times daily for 5 days.    Dispense:  40 capsule    Refill:  0     Note: This dictation was prepared with Dragon dictation along with smaller phrase technology. Similar sounding words can be transcribed inadequately or may not be corrected upon review. Any transcriptional errors that result from this process are unintentional.      Disposition:  Follow up  Signed, Lindell Spar, MD  10/15/2020 8:23 AM     [facility]

## 2020-10-15 NOTE — Patient Instructions (Signed)

## 2020-11-13 ENCOUNTER — Other Ambulatory Visit (HOSPITAL_COMMUNITY): Payer: Self-pay

## 2020-11-13 MED FILL — Metronidazole Cream 0.75%: CUTANEOUS | 30 days supply | Qty: 45 | Fill #2 | Status: AC

## 2020-11-13 MED FILL — Doxycycline Hyclate Cap 50 MG: ORAL | 30 days supply | Qty: 30 | Fill #2 | Status: AC

## 2020-11-15 ENCOUNTER — Other Ambulatory Visit (HOSPITAL_COMMUNITY): Payer: Self-pay

## 2020-11-19 ENCOUNTER — Encounter: Payer: 59 | Admitting: Internal Medicine

## 2020-11-23 ENCOUNTER — Encounter: Payer: 59 | Admitting: Family Medicine

## 2020-12-03 DIAGNOSIS — H47093 Other disorders of optic nerve, not elsewhere classified, bilateral: Secondary | ICD-10-CM | POA: Diagnosis not present

## 2020-12-03 DIAGNOSIS — H2513 Age-related nuclear cataract, bilateral: Secondary | ICD-10-CM | POA: Diagnosis not present

## 2020-12-03 DIAGNOSIS — H524 Presbyopia: Secondary | ICD-10-CM | POA: Diagnosis not present

## 2020-12-24 ENCOUNTER — Other Ambulatory Visit (HOSPITAL_COMMUNITY): Payer: Self-pay

## 2020-12-24 MED FILL — Metronidazole Cream 0.75%: CUTANEOUS | 30 days supply | Qty: 45 | Fill #3 | Status: AC

## 2020-12-24 MED FILL — Doxycycline Hyclate Cap 50 MG: ORAL | 30 days supply | Qty: 30 | Fill #3 | Status: AC

## 2021-01-07 ENCOUNTER — Other Ambulatory Visit (HOSPITAL_COMMUNITY): Payer: Self-pay

## 2021-01-07 DIAGNOSIS — L718 Other rosacea: Secondary | ICD-10-CM | POA: Diagnosis not present

## 2021-01-07 DIAGNOSIS — L438 Other lichen planus: Secondary | ICD-10-CM | POA: Diagnosis not present

## 2021-01-07 DIAGNOSIS — D225 Melanocytic nevi of trunk: Secondary | ICD-10-CM | POA: Diagnosis not present

## 2021-01-07 DIAGNOSIS — D1801 Hemangioma of skin and subcutaneous tissue: Secondary | ICD-10-CM | POA: Diagnosis not present

## 2021-01-07 DIAGNOSIS — L819 Disorder of pigmentation, unspecified: Secondary | ICD-10-CM | POA: Diagnosis not present

## 2021-01-07 DIAGNOSIS — L821 Other seborrheic keratosis: Secondary | ICD-10-CM | POA: Diagnosis not present

## 2021-01-07 MED ORDER — DOXYCYCLINE HYCLATE 50 MG PO CAPS
50.0000 mg | ORAL_CAPSULE | Freq: Every day | ORAL | 0 refills | Status: DC
  Filled 2021-01-07: qty 30, 30d supply, fill #0

## 2021-01-07 MED ORDER — METRONIDAZOLE 0.75 % EX CREA
TOPICAL_CREAM | CUTANEOUS | 11 refills | Status: DC
  Filled 2021-01-07: qty 45, 30d supply, fill #0
  Filled 2021-03-09: qty 45, 30d supply, fill #1
  Filled 2021-06-21: qty 45, 30d supply, fill #2
  Filled 2021-07-29: qty 45, 30d supply, fill #3
  Filled 2021-09-05: qty 45, 30d supply, fill #4
  Filled 2021-10-21: qty 45, 30d supply, fill #5
  Filled 2021-11-16: qty 45, 30d supply, fill #6
  Filled 2021-12-16: qty 45, 30d supply, fill #7

## 2021-01-17 ENCOUNTER — Other Ambulatory Visit (HOSPITAL_COMMUNITY): Payer: Self-pay

## 2021-01-18 ENCOUNTER — Other Ambulatory Visit (HOSPITAL_COMMUNITY): Payer: Self-pay

## 2021-03-08 ENCOUNTER — Encounter: Payer: 59 | Admitting: Internal Medicine

## 2021-03-09 ENCOUNTER — Ambulatory Visit (INDEPENDENT_AMBULATORY_CARE_PROVIDER_SITE_OTHER): Payer: 59 | Admitting: Internal Medicine

## 2021-03-09 ENCOUNTER — Encounter: Payer: Self-pay | Admitting: Internal Medicine

## 2021-03-09 ENCOUNTER — Other Ambulatory Visit: Payer: Self-pay

## 2021-03-09 ENCOUNTER — Other Ambulatory Visit (HOSPITAL_COMMUNITY): Payer: Self-pay

## 2021-03-09 VITALS — BP 118/78 | HR 75 | Resp 18 | Ht 67.0 in | Wt 143.0 lb

## 2021-03-09 DIAGNOSIS — Z0001 Encounter for general adult medical examination with abnormal findings: Secondary | ICD-10-CM | POA: Insufficient documentation

## 2021-03-09 DIAGNOSIS — M19042 Primary osteoarthritis, left hand: Secondary | ICD-10-CM

## 2021-03-09 DIAGNOSIS — F5101 Primary insomnia: Secondary | ICD-10-CM

## 2021-03-09 DIAGNOSIS — Z1159 Encounter for screening for other viral diseases: Secondary | ICD-10-CM | POA: Diagnosis not present

## 2021-03-09 DIAGNOSIS — Z114 Encounter for screening for human immunodeficiency virus [HIV]: Secondary | ICD-10-CM | POA: Diagnosis not present

## 2021-03-09 DIAGNOSIS — Z23 Encounter for immunization: Secondary | ICD-10-CM | POA: Diagnosis not present

## 2021-03-09 DIAGNOSIS — Z1211 Encounter for screening for malignant neoplasm of colon: Secondary | ICD-10-CM | POA: Diagnosis not present

## 2021-03-09 DIAGNOSIS — E559 Vitamin D deficiency, unspecified: Secondary | ICD-10-CM

## 2021-03-09 DIAGNOSIS — Z1231 Encounter for screening mammogram for malignant neoplasm of breast: Secondary | ICD-10-CM

## 2021-03-09 DIAGNOSIS — M19041 Primary osteoarthritis, right hand: Secondary | ICD-10-CM

## 2021-03-09 MED ORDER — HYDROXYZINE PAMOATE 25 MG PO CAPS
25.0000 mg | ORAL_CAPSULE | Freq: Every evening | ORAL | 4 refills | Status: DC | PRN
  Filled 2021-03-09: qty 30, 30d supply, fill #0
  Filled 2021-06-21: qty 30, 30d supply, fill #1
  Filled 2021-09-05: qty 30, 30d supply, fill #2
  Filled 2021-10-21: qty 30, 30d supply, fill #3
  Filled 2022-03-09: qty 30, 30d supply, fill #4

## 2021-03-09 NOTE — Assessment & Plan Note (Signed)
Tylenol PRN Heating pad PRN and/or Voltaren gel PRN for pain.

## 2021-03-09 NOTE — Assessment & Plan Note (Addendum)
Did not have effect from Lunesta Started Vistaril 25 mg qHS PRN Maintain sleep hygiene

## 2021-03-09 NOTE — Progress Notes (Signed)
Established Patient Office Visit  Subjective:  Patient ID: Christine Marquez, female    DOB: July 02, 1960  Age: 60 y.o. MRN: [id number]  CC:  Chief Complaint  Patient presents with   Annual Exam    Annual exam     HPI Christine Marquez is a 60 year old female with PMH of rosacea and insomnia who presents for annual physical.  She has been doing well overall.  She has chronic insomnia, for which she has tried [place] with no benefit. She denies any anhedonia, anxiety spells, SI or HI.  She has chronic small joint pain in hands and elbow, which is like stiffness and worse in the morning. She gets relief with heating pad application.  She received shingrix vaccine in the office today.  History reviewed. No pertinent past medical history.  Past Surgical History:  Procedure Laterality Date   ABDOMINAL HYSTERECTOMY     HEMORRHOID SURGERY      Family History  Problem Relation Age of Onset   Heart failure Father    Diabetes Other    Cancer Maternal Grandmother    Cancer Maternal Grandfather     Social History   Socioeconomic History   Marital status: Divorced    Spouse name: Not on file   Number of children: 2   Years of education: Not on file   Highest education level: Associate degree: academic program  Occupational History    Comment: RN   Tobacco Use   Smoking status: Former    Types: Cigarettes    Quit date: 05/22/2013    Years since quitting: 7.8   Smokeless tobacco: Never  Substance and Sexual Activity   Alcohol use: No   Drug use: No   Sexual activity: Not Currently    Birth control/protection: Surgical  Other Topics Concern   Not on file  Social History Narrative   Lives with youngest son who is disabled- had a TBI first year of college   Oldest lives close by-3 grandchildren      Dog: Bell   2 cats: Peppers, Bean       Enjoys: off time with youngest grandson that is 23 years old, weight lifting, planks, and core with walk      Diet: eats all food  groups   Caffeine: 1 cup coffee daily at home, at work 3 cups daily   Water: 1 cup daily- needs to work on that      Wears seat belt    Does not use phone while driving    Some detectors at home   Social Determinants of Health   Financial Resource Strain: Not on file  Food Insecurity: Not on file  Transportation Needs: Not on file  Physical Activity: Not on file  Stress: Not on file  Social Connections: Not on file  Intimate Partner Violence: Not on file    Outpatient Medications Prior to Visit  Medication Sig Dispense Refill   acetaminophen (TYLENOL) 325 MG tablet Take 650 mg by mouth every 6 (six) hours as needed for moderate pain.     doxycycline (VIBRAMYCIN) 50 MG capsule Take 1 capsule by mouth daily. Take with food and water, use sun protection. 30 capsule 0   metroNIDAZOLE (METROCREAM) 0.75 % cream Apply 1 application on the skin twice a day; Apply dime-size amount to entire face 45 g 11   valACYclovir (VALTREX) 1000 MG tablet TAKE 2 TABLETS BY MOUTH 2 TIMES DAILY FOR 1 DAY 8 tablet 0   eszopiclone (LUNESTA) 1  MG TABS tablet Take 1 tablet (1 mg total) by mouth at bedtime as needed for sleep. Take immediately before bedtime 30 tablet 0   No facility-administered medications prior to visit.    Allergies  Allergen Reactions   Penicillins Hives    ROS Review of Systems  Constitutional:  Negative for chills and fever.  HENT:  Negative for congestion, sinus pressure, sinus pain and sore throat.   Eyes:  Negative for pain and discharge.  Respiratory:  Negative for cough and shortness of breath.   Cardiovascular:  Negative for chest pain and palpitations.  Gastrointestinal:  Negative for abdominal pain, constipation, diarrhea, nausea and vomiting.  Endocrine: Negative for polydipsia and polyuria.  Genitourinary:  Negative for dysuria and hematuria.  Musculoskeletal:  Positive for arthralgias. Negative for neck pain and neck stiffness.  Skin:  Negative for rash.   Neurological:  Negative for dizziness and weakness.  Psychiatric/Behavioral:  Positive for sleep disturbance. Negative for agitation and behavioral problems.      Objective:    Physical Exam Vitals reviewed.  Constitutional:      General: She is not in acute distress.    Appearance: She is not diaphoretic.  HENT:     Head: Normocephalic and atraumatic.     Nose: Nose normal. No congestion.     Mouth/Throat:     Mouth: Mucous membranes are moist.     Pharynx: No posterior oropharyngeal erythema.  Eyes:     General: No scleral icterus.    Extraocular Movements: Extraocular movements intact.  Cardiovascular:     Rate and Rhythm: Normal rate and regular rhythm.     Pulses: Normal pulses.     Heart sounds: Normal heart sounds. No murmur heard. Pulmonary:     Breath sounds: Normal breath sounds. No wheezing or rales.  Abdominal:     Palpations: Abdomen is soft.     Tenderness: There is no abdominal tenderness.  Musculoskeletal:        General: Swelling (Mild - MCP and DIP joints b/l) present.     Cervical back: Neck supple. No tenderness.     Right lower leg: No edema.     Left lower leg: No edema.  Skin:    General: Skin is warm.     Findings: No rash.  Neurological:     General: No focal deficit present.     Mental Status: She is alert and oriented to person, place, and time.     Cranial Nerves: No cranial nerve deficit.     Sensory: No sensory deficit.     Motor: No weakness.  Psychiatric:        Mood and Affect: Mood normal.        Behavior: Behavior normal.    BP 118/78 (BP Location: Left Arm, Patient Position: Sitting, Cuff Size: Normal)   Pulse 75   Resp 18   Ht 5' 7"  (1.702 m)   Wt 143 lb (64.9 kg)   SpO2 97%   BMI 22.40 kg/m  Wt Readings from Last 3 Encounters:  03/09/21 143 lb (64.9 kg)  11/19/19 131 lb 12.8 oz (59.8 kg)  09/19/19 134 lb 3.2 oz (60.9 kg)     Health Maintenance Due  Topic Date Due   COVID-19 Vaccine (1) Never done   Hepatitis C  Screening  Never done   TETANUS/TDAP  Never done   COLONOSCOPY (Pts 45-60yr Insurance coverage will need to be confirmed)  Never done   Zoster Vaccines- Shingrix (1 of 2)  Never done   MAMMOGRAM  11/08/2019    There are no preventive care reminders to display for this patient.  Lab Results  Component Value Date   TSH 0.53 11/19/2019   Lab Results  Component Value Date   WBC 4.7 11/19/2019   HGB 14.0 11/19/2019   HCT 43.2 11/19/2019   MCV 98.0 11/19/2019   PLT 231 11/19/2019   Lab Results  Component Value Date   NA 140 11/19/2019   K 4.6 11/19/2019   CO2 31 11/19/2019   GLUCOSE 71 11/19/2019   BUN 18 11/19/2019   CREATININE 0.73 11/19/2019   BILITOT 0.6 11/19/2019   ALKPHOS 96 11/07/2017   AST 21 11/19/2019   ALT 16 11/19/2019   PROT 7.3 11/19/2019   ALBUMIN 4.5 11/07/2017   CALCIUM 10.0 11/19/2019   ANIONGAP 9 11/07/2017   Lab Results  Component Value Date   CHOL 206 (H) 11/19/2019   Lab Results  Component Value Date   HDL 94 11/19/2019   Lab Results  Component Value Date   LDLCALC 98 11/19/2019   Lab Results  Component Value Date   TRIG 47 11/19/2019   Lab Results  Component Value Date   CHOLHDL 2.2 11/19/2019   Lab Results  Component Value Date   HGBA1C 5.0 11/19/2019      Assessment & Plan:   Problem List Items Addressed This Visit       Encounter for general adult medical examination with abnormal findings - Primary   Physical exam as documented. Counseling done  re healthy lifestyle involving commitment to 150 minutes exercise per week, heart healthy diet, and attaining healthy weight.The importance of adequate sleep also discussed. Changes in health habits are decided on by the patient with goals and time frames  set for achieving them. Immunization and cancer screening needs are specifically addressed at this visit.     Relevant Orders  CBC with Differential/Platelet  CMP14+EGFR  HgB A1c  Lipid panel  TSH    Musculoskeletal  and Integument   Primary osteoarthritis of both hands    Tylenol PRN Heating pad PRN and/or Voltaren gel PRN for pain.        Other   Primary insomnia    Did not have effect from Lunesta Started Vistaril 25 mg qHS PRN Maintain sleep hygiene      Relevant Medications   hydrOXYzine (VISTARIL) 25 MG capsule      Other Visit Diagnoses     Need for hepatitis C screening test       Relevant Orders   Hepatitis C Antibody   Encounter for screening for HIV       Relevant Orders   HIV antibody (with reflex)   Vitamin D deficiency       Relevant Orders   Vitamin D (25 hydroxy)   Screening for colon cancer       Relevant Orders   Cologuard   Screening mammogram for breast cancer       Relevant Orders   MM 3D SCREEN BREAST BILATERAL       Meds ordered this encounter  Medications   hydrOXYzine (VISTARIL) 25 MG capsule    Sig: Take 1 capsule (25 mg total) by mouth at bedtime as needed for anxiety (Insmonia).    Dispense:  30 capsule    Refill:  4    Follow-up: Return in about 1 year (around 03/09/2022) for Annual physical.    Lindell Spar, MD

## 2021-03-09 NOTE — Assessment & Plan Note (Signed)

## 2021-03-09 NOTE — Patient Instructions (Signed)
Health Maintenance, Female Adopting a healthy lifestyle and getting preventive care are important in promoting health and wellness. Ask your health care provider about: The right schedule for you to have regular tests and exams. Things you can do on your own to prevent diseases and keep yourself healthy. What should I know about diet, weight, and exercise? Eat a healthy diet  Eat a diet that includes plenty of vegetables, fruits, low-fat dairy products, and lean protein. Do not eat a lot of foods that are high in solid fats, added sugars, or sodium. Maintain a healthy weight Body mass index (BMI) is used to identify weight problems. It estimates body fat based on height and weight. Your health care provider can help determine your BMI and help you achieve or maintain a healthy weight. Get regular exercise Get regular exercise. This is one of the most important things you can do for your health. Most adults should: Exercise for at least 150 minutes each week. The exercise should increase your heart rate and make you sweat (moderate-intensity exercise). Do strengthening exercises at least twice a week. This is in addition to the moderate-intensity exercise. Spend less time sitting. Even light physical activity can be beneficial. Watch cholesterol and blood lipids Have your blood tested for lipids and cholesterol at 62 years of age, then have this test every 5 years. Have your cholesterol levels checked more often if: Your lipid or cholesterol levels are high. You are older than 62 years of age. You are at high risk for heart disease. What should I know about cancer screening? Depending on your health history and family history, you may need to have cancer screening at various ages. This may include screening for: Breast cancer. Cervical cancer. Colorectal cancer. Skin cancer. Lung cancer. What should I know about heart disease, diabetes, and high blood pressure? Blood pressure and heart  disease High blood pressure causes heart disease and increases the risk of stroke. This is more likely to develop in people who have high blood pressure readings, are of African descent, or are overweight. Have your blood pressure checked: Every 3-5 years if you are 18-39 years of age. Every year if you are 40 years old or older. Diabetes Have regular diabetes screenings. This checks your fasting blood sugar level. Have the screening done: Once every three years after age 40 if you are at a normal weight and have a low risk for diabetes. More often and at a younger age if you are overweight or have a high risk for diabetes. What should I know about preventing infection? Hepatitis B If you have a higher risk for hepatitis B, you should be screened for this virus. Talk with your health care provider to find out if you are at risk for hepatitis B infection. Hepatitis C Testing is recommended for: Everyone born from 1945 through 1965. Anyone with known risk factors for hepatitis C. Sexually transmitted infections (STIs) Get screened for STIs, including gonorrhea and chlamydia, if: You are sexually active and are younger than 62 years of age. You are older than 62 years of age and your health care provider tells you that you are at risk for this type of infection. Your sexual activity has changed since you were last screened, and you are at increased risk for chlamydia or gonorrhea. Ask your health care provider if you are at risk. Ask your health care provider about whether you are at high risk for HIV. Your health care provider may recommend a prescription medicine   to help prevent HIV infection. If you choose to take medicine to prevent HIV, you should first get tested for HIV. You should then be tested every 3 months for as long as you are taking the medicine. Pregnancy If you are about to stop having your period (premenopausal) and you may become pregnant, seek counseling before you get  pregnant. Take 400 to 800 micrograms (mcg) of folic acid every day if you become pregnant. Ask for birth control (contraception) if you want to prevent pregnancy. Osteoporosis and menopause Osteoporosis is a disease in which the bones lose minerals and strength with aging. This can result in bone fractures. If you are 65 years old or older, or if you are at risk for osteoporosis and fractures, ask your health care provider if you should: Be screened for bone loss. Take a calcium or vitamin D supplement to lower your risk of fractures. Be given hormone replacement therapy (HRT) to treat symptoms of menopause. Follow these instructions at home: Lifestyle Do not use any products that contain nicotine or tobacco, such as cigarettes, e-cigarettes, and chewing tobacco. If you need help quitting, ask your health care provider. Do not use street drugs. Do not share needles. Ask your health care provider for help if you need support or information about quitting drugs. Alcohol use Do not drink alcohol if: Your health care provider tells you not to drink. You are pregnant, may be pregnant, or are planning to become pregnant. If you drink alcohol: Limit how much you use to 0-1 drink a day. Limit intake if you are breastfeeding. Be aware of how much alcohol is in your drink. In the [place] one drink equals one 12 oz bottle of beer (355 mL), one 5 oz glass of wine (148 mL), or one 1 oz glass of hard liquor (44 mL). General instructions Schedule regular health, dental, and eye exams. Stay current with your vaccines. Tell your health care provider if: You often feel depressed. You have ever been abused or do not feel safe at home. Summary Adopting a healthy lifestyle and getting preventive care are important in promoting health and wellness. Follow your health care provider's instructions about healthy diet, exercising, and getting tested or screened for diseases. Follow your health care provider's  instructions on monitoring your cholesterol and blood pressure. This information is not intended to replace advice given to you by your health care provider. Make sure you discuss any questions you have with your health care provider. Document Revised: 07/16/2020 Document Reviewed: 05/01/2018 Elsevier Patient Education  2022 [employer]  

## 2021-03-10 ENCOUNTER — Other Ambulatory Visit (HOSPITAL_COMMUNITY): Payer: Self-pay

## 2021-03-10 DIAGNOSIS — Z1211 Encounter for screening for malignant neoplasm of colon: Secondary | ICD-10-CM | POA: Diagnosis not present

## 2021-03-10 DIAGNOSIS — Z1159 Encounter for screening for other viral diseases: Secondary | ICD-10-CM | POA: Diagnosis not present

## 2021-03-10 DIAGNOSIS — Z1231 Encounter for screening mammogram for malignant neoplasm of breast: Secondary | ICD-10-CM | POA: Diagnosis not present

## 2021-03-10 DIAGNOSIS — E559 Vitamin D deficiency, unspecified: Secondary | ICD-10-CM | POA: Diagnosis not present

## 2021-03-10 DIAGNOSIS — Z23 Encounter for immunization: Secondary | ICD-10-CM | POA: Diagnosis not present

## 2021-03-10 DIAGNOSIS — F5101 Primary insomnia: Secondary | ICD-10-CM | POA: Diagnosis not present

## 2021-03-10 DIAGNOSIS — Z114 Encounter for screening for human immunodeficiency virus [HIV]: Secondary | ICD-10-CM | POA: Diagnosis not present

## 2021-03-10 DIAGNOSIS — Z0001 Encounter for general adult medical examination with abnormal findings: Secondary | ICD-10-CM | POA: Diagnosis not present

## 2021-03-10 DIAGNOSIS — M19041 Primary osteoarthritis, right hand: Secondary | ICD-10-CM | POA: Diagnosis not present

## 2021-03-11 LAB — CBC WITH DIFFERENTIAL/PLATELET
Basophils Absolute: 0 10*3/uL (ref 0.0–0.2)
Basos: 1 %
EOS (ABSOLUTE): 0 10*3/uL (ref 0.0–0.4)
Eos: 1 %
Hematocrit: 40.2 % (ref 34.0–46.6)
Hemoglobin: 13.7 g/dL (ref 11.1–15.9)
Immature Grans (Abs): 0 10*3/uL (ref 0.0–0.1)
Immature Granulocytes: 0 %
Lymphocytes Absolute: 1.6 10*3/uL (ref 0.7–3.1)
Lymphs: 34 %
MCH: 31.9 pg (ref 26.6–33.0)
MCHC: 34.1 g/dL (ref 31.5–35.7)
MCV: 94 fL (ref 79–97)
Monocytes Absolute: 0.6 10*3/uL (ref 0.1–0.9)
Monocytes: 12 %
Neutrophils Absolute: 2.4 10*3/uL (ref 1.4–7.0)
Neutrophils: 52 %
Platelets: 235 10*3/uL (ref 150–450)
RBC: 4.3 x10E6/uL (ref 3.77–5.28)
RDW: 12.1 % (ref 11.7–15.4)
WBC: 4.6 10*3/uL (ref 3.4–10.8)

## 2021-03-11 LAB — CMP14+EGFR
ALT: 19 IU/L (ref 0–32)
AST: 26 IU/L (ref 0–40)
Albumin/Globulin Ratio: 2.3 — ABNORMAL HIGH (ref 1.2–2.2)
Albumin: 5 g/dL — ABNORMAL HIGH (ref 3.8–4.8)
Alkaline Phosphatase: 130 IU/L — ABNORMAL HIGH (ref 44–121)
BUN/Creatinine Ratio: 19 (ref 12–28)
BUN: 13 mg/dL (ref 8–27)
Bilirubin Total: 0.4 mg/dL (ref 0.0–1.2)
CO2: 26 mmol/L (ref 20–29)
Calcium: 9.7 mg/dL (ref 8.7–10.3)
Chloride: 102 mmol/L (ref 96–106)
Creatinine, Ser: 0.68 mg/dL (ref 0.57–1.00)
Globulin, Total: 2.2 g/dL (ref 1.5–4.5)
Glucose: 75 mg/dL (ref 70–99)
Potassium: 4.7 mmol/L (ref 3.5–5.2)
Sodium: 142 mmol/L (ref 134–144)
Total Protein: 7.2 g/dL (ref 6.0–8.5)
eGFR: 98 mL/min/{1.73_m2} (ref >59)

## 2021-03-11 LAB — HEPATITIS C ANTIBODY: Hep C Virus Ab: <0.1 s/co ratio (ref 0.0–0.9)

## 2021-03-11 LAB — LIPID PANEL
Chol/HDL Ratio: 2 ratio (ref 0.0–4.4)
Cholesterol, Total: 188 mg/dL (ref 100–199)
HDL: 92 mg/dL (ref >39)
LDL Chol Calc (NIH): 85 mg/dL (ref 0–99)
Triglycerides: 57 mg/dL (ref 0–149)
VLDL Cholesterol Cal: 11 mg/dL (ref 5–40)

## 2021-03-11 LAB — HEMOGLOBIN A1C
Est. average glucose Bld gHb Est-mCnc: 108 mg/dL
Hgb A1c MFr Bld: 5.4 % (ref 4.8–5.6)

## 2021-03-11 LAB — HIV ANTIBODY (ROUTINE TESTING W REFLEX): HIV Screen 4th Generation wRfx: NONREACTIVE

## 2021-03-11 LAB — TSH: TSH: 0.834 u[IU]/mL (ref 0.450–4.500)

## 2021-03-11 LAB — VITAMIN D 25 HYDROXY (VIT D DEFICIENCY, FRACTURES): Vit D, 25-Hydroxy: 21.9 ng/mL — ABNORMAL LOW (ref 30.0–100.0)

## 2021-03-14 ENCOUNTER — Other Ambulatory Visit (HOSPITAL_COMMUNITY): Payer: Self-pay

## 2021-03-14 MED ORDER — DOXYCYCLINE HYCLATE 50 MG PO CAPS
ORAL_CAPSULE | ORAL | 1 refills | Status: DC
  Filled 2021-03-14: qty 30, 30d supply, fill #0
  Filled 2021-06-21: qty 30, 30d supply, fill #1

## 2021-03-25 DIAGNOSIS — Z1211 Encounter for screening for malignant neoplasm of colon: Secondary | ICD-10-CM | POA: Diagnosis not present

## 2021-03-31 LAB — COLOGUARD: COLOGUARD: NEGATIVE

## 2021-04-01 ENCOUNTER — Other Ambulatory Visit (HOSPITAL_COMMUNITY): Payer: Self-pay

## 2021-04-01 ENCOUNTER — Other Ambulatory Visit: Payer: Self-pay | Admitting: *Deleted

## 2021-04-01 MED ORDER — VALACYCLOVIR HCL 1 G PO TABS
ORAL_TABLET | ORAL | 0 refills | Status: DC
  Filled 2021-04-01: qty 8, 2d supply, fill #0

## 2021-06-21 ENCOUNTER — Other Ambulatory Visit (HOSPITAL_COMMUNITY): Payer: Self-pay

## 2021-06-28 ENCOUNTER — Other Ambulatory Visit (HOSPITAL_COMMUNITY): Payer: Self-pay

## 2021-07-19 ENCOUNTER — Ambulatory Visit: Payer: 59 | Admitting: Internal Medicine

## 2021-07-19 ENCOUNTER — Encounter: Payer: Self-pay | Admitting: Internal Medicine

## 2021-07-19 ENCOUNTER — Other Ambulatory Visit: Payer: Self-pay

## 2021-07-19 ENCOUNTER — Other Ambulatory Visit (HOSPITAL_COMMUNITY): Payer: Self-pay

## 2021-07-19 ENCOUNTER — Other Ambulatory Visit: Payer: Self-pay | Admitting: Internal Medicine

## 2021-07-19 VITALS — BP 132/78 | HR 77 | Resp 18 | Ht 67.5 in | Wt 141.4 lb

## 2021-07-19 DIAGNOSIS — N3946 Mixed incontinence: Secondary | ICD-10-CM | POA: Diagnosis not present

## 2021-07-19 DIAGNOSIS — H6502 Acute serous otitis media, left ear: Secondary | ICD-10-CM | POA: Diagnosis not present

## 2021-07-19 DIAGNOSIS — M25512 Pain in left shoulder: Secondary | ICD-10-CM

## 2021-07-19 DIAGNOSIS — R0789 Other chest pain: Secondary | ICD-10-CM | POA: Insufficient documentation

## 2021-07-19 DIAGNOSIS — S46812A Strain of other muscles, fascia and tendons at shoulder and upper arm level, left arm, initial encounter: Secondary | ICD-10-CM | POA: Diagnosis not present

## 2021-07-19 DIAGNOSIS — R079 Chest pain, unspecified: Secondary | ICD-10-CM | POA: Insufficient documentation

## 2021-07-19 MED ORDER — VALACYCLOVIR HCL 1 G PO TABS
ORAL_TABLET | ORAL | 0 refills | Status: DC
  Filled 2021-07-19: qty 8, 2d supply, fill #0

## 2021-07-19 MED ORDER — CYCLOBENZAPRINE HCL 5 MG PO TABS
5.0000 mg | ORAL_TABLET | Freq: Three times a day (TID) | ORAL | 1 refills | Status: DC | PRN
  Filled 2021-07-19: qty 30, 10d supply, fill #0
  Filled 2021-09-05: qty 30, 10d supply, fill #1

## 2021-07-19 MED ORDER — OXYBUTYNIN CHLORIDE ER 10 MG PO TB24
10.0000 mg | ORAL_TABLET | Freq: Every day | ORAL | 5 refills | Status: DC
Start: 2021-07-19 — End: 2022-03-09
  Filled 2021-07-19: qty 30, 30d supply, fill #0

## 2021-07-19 MED ORDER — AZITHROMYCIN 250 MG PO TABS
ORAL_TABLET | ORAL | 0 refills | Status: AC
  Filled 2021-07-19: qty 6, 5d supply, fill #0

## 2021-07-19 NOTE — Patient Instructions (Addendum)
Please take Flexeril for muscle spasm. Please perform simple neck exercises as discussed.  You are being referred to Orthopedic surgeon for shoulder pain.  Please take Azithromycin for ear infection.  Please start taking Oxybutynin for urinary incontinence. Please perform Kegel exercises for stress incontinence.

## 2021-07-19 NOTE — Progress Notes (Signed)
Established Patient Office Visit  Subjective:  Patient ID: Christine Marquez, female    DOB: 11/30/1960  Age: 61 y.o. MRN: [id number]  CC:  Chief Complaint  Patient presents with   Neck Pain    Pt has been having left shoulder and neck pain and ear feels full on left side all the time also pain in the middle of her chest on and off has also been leaking urine     HPI Christine Marquez is a 61 y.o. female who presents for complaint of neck pain and left shoulder pain.  She complains of left-sided neck pain and shoulder pain radiating to the left arm for about a month now, from 02/01.  Pain is worse on lateral flexion on right side. She also has a hard popping sounds in her left shoulder.  Denies any numbness or tingling of the UE.  Denies any recent injury.  She also complains of left ear fullness and pain, sometimes radiating towards TM joint area for the last 2 days.  Denies any ear discharge.  Denies any fever or chills currently.  She complains of substernal chest pain, which is intermittent, unrelated to activity status.  Denies any dizziness, diaphoresis, palpitations.  She feels like indigestion at times.  She also complains of chronic urinary incontinence.  She feels as if her bladder is not emptying completely at the end of voiding.  She also complains of urgency and urinary leakage while coughing worse sneezing.  Denies any dysuria or hematuria currently.  History reviewed. No pertinent past medical history.  Past Surgical History:  Procedure Laterality Date   ABDOMINAL HYSTERECTOMY     HEMORRHOID SURGERY      Family History  Problem Relation Age of Onset   Heart failure Father    Diabetes Other    Cancer Maternal Grandmother    Cancer Maternal Grandfather     Social History   Socioeconomic History   Marital status: Divorced    Spouse name: Not on file   Number of children: 2   Years of education: Not on file   Highest education level: Associate degree: academic  program  Occupational History    Comment: RN   Tobacco Use   Smoking status: Former    Types: Cigarettes    Quit date: 05/22/2013    Years since quitting: 8.1   Smokeless tobacco: Never  Substance and Sexual Activity   Alcohol use: No   Drug use: No   Sexual activity: Not Currently    Birth control/protection: Surgical  Other Topics Concern   Not on file  Social History Narrative   Lives with youngest son who is disabled- had a TBI first year of college   Oldest lives close by-3 grandchildren      Dog: Bell   2 cats: Peppers, Bean       Enjoys: off time with youngest grandson that is 106 years old, weight lifting, planks, and core with walk      Diet: eats all food groups   Caffeine: 1 cup coffee daily at home, at work 3 cups daily   Water: 1 cup daily- needs to work on that      Wears seat belt    Does not use phone while driving    Some detectors at home   Social Determinants of Health   Financial Resource Strain: Not on file  Food Insecurity: Not on file  Transportation Needs: Not on file  Physical Activity: Not on  file  Stress: Not on file  Social Connections: Not on file  Intimate Partner Violence: Not on file    Outpatient Medications Prior to Visit  Medication Sig Dispense Refill   acetaminophen (TYLENOL) 325 MG tablet Take 650 mg by mouth every 6 (six) hours as needed for moderate pain.     hydrOXYzine (VISTARIL) 25 MG capsule Take 1 capsule (25 mg total) by mouth at bedtime as needed for anxiety (Insmonia). 30 capsule 4   metroNIDAZOLE (METROCREAM) 0.75 % cream Apply 1 application on the skin twice a day; Apply dime-size amount to entire face 45 g 11   valACYclovir (VALTREX) 1000 MG tablet TAKE 2 TABLETS BY MOUTH 2 TIMES DAILY FOR 1 DAY 8 tablet 0   doxycycline (VIBRAMYCIN) 50 MG capsule Take 1 capsule by mouth once daily with food and water as directed. Use sun protection. (Patient not taking: Reported on 07/19/2021) 30 capsule 1   doxycycline (VIBRAMYCIN) 50  MG capsule Take 1 capsule by mouth daily. Take with food and water, use sun protection. 30 capsule 0   No facility-administered medications prior to visit.    Allergies  Allergen Reactions   Penicillins Hives    ROS Review of Systems  Constitutional:  Positive for fatigue. Negative for chills and fever.  HENT:  Negative for congestion and sore throat.   Respiratory:  Negative for cough and shortness of breath.   Cardiovascular:  Negative for chest pain and palpitations.  Gastrointestinal:  Negative for nausea and vomiting.  Genitourinary:  Positive for urgency. Negative for dysuria and hematuria.  Musculoskeletal:  Positive for arthralgias (L shoulder) and neck pain. Negative for neck stiffness.  Skin:  Negative for rash.  Neurological:  Negative for dizziness and weakness.  Psychiatric/Behavioral:  Negative for agitation and behavioral problems.      Objective:    Physical Exam Vitals reviewed.  Constitutional:      General: She is not in acute distress.    Appearance: She is not diaphoretic.  HENT:     Head: Normocephalic and atraumatic.     Right Ear: There is no impacted cerumen.     Left Ear: A middle ear effusion is present. There is no impacted cerumen.     Nose: Nose normal. No congestion.     Mouth/Throat:     Mouth: Mucous membranes are moist.     Pharynx: No posterior oropharyngeal erythema.  Eyes:     General: No scleral icterus.    Extraocular Movements: Extraocular movements intact.  Neck:     Comments: ROM limited due to pain Cardiovascular:     Rate and Rhythm: Normal rate and regular rhythm.     Pulses: Normal pulses.     Heart sounds: Normal heart sounds. No murmur heard. Pulmonary:     Breath sounds: Normal breath sounds. No wheezing or rales.  Abdominal:     Palpations: Abdomen is soft.     Tenderness: There is no abdominal tenderness.  Musculoskeletal:        General: Swelling (Mild - MCP and DIP joints b/l) and tenderness (Left trapezius  area) present.     Cervical back: Neck supple.     Right lower leg: No edema.     Left lower leg: No edema.  Skin:    General: Skin is warm.     Findings: No rash.  Neurological:     General: No focal deficit present.     Mental Status: She is alert and oriented to person, place,  and time.     Sensory: No sensory deficit.     Motor: No weakness.  Psychiatric:        Mood and Affect: Mood normal.        Behavior: Behavior normal.    BP 132/78 (BP Location: Left Arm, Patient Position: Sitting, Cuff Size: Normal)    Pulse 77    Resp 18    Ht 5' 7.5" (1.715 m)    Wt 141 lb 6.4 oz (64.1 kg)    SpO2 97%    BMI 21.82 kg/m  Wt Readings from Last 3 Encounters:  07/19/21 141 lb 6.4 oz (64.1 kg)  03/09/21 143 lb (64.9 kg)  11/19/19 131 lb 12.8 oz (59.8 kg)    Lab Results  Component Value Date   TSH 0.834 03/09/2021   Lab Results  Component Value Date   WBC 4.6 03/09/2021   HGB 13.7 03/09/2021   HCT 40.2 03/09/2021   MCV 94 03/09/2021   PLT 235 03/09/2021   Lab Results  Component Value Date   NA 142 03/09/2021   K 4.7 03/09/2021   CO2 26 03/09/2021   GLUCOSE 75 03/09/2021   BUN 13 03/09/2021   CREATININE 0.68 03/09/2021   BILITOT 0.4 03/09/2021   ALKPHOS 130 (H) 03/09/2021   AST 26 03/09/2021   ALT 19 03/09/2021   PROT 7.2 03/09/2021   ALBUMIN 5.0 (H) 03/09/2021   CALCIUM 9.7 03/09/2021   ANIONGAP 9 11/07/2017   EGFR 98 03/09/2021   Lab Results  Component Value Date   CHOL 188 03/09/2021   Lab Results  Component Value Date   HDL 92 03/09/2021   Lab Results  Component Value Date   LDLCALC 85 03/09/2021   Lab Results  Component Value Date   TRIG 57 03/09/2021   Lab Results  Component Value Date   CHOLHDL 2.0 03/09/2021   Lab Results  Component Value Date   HGBA1C 5.4 03/09/2021      Assessment & Plan:   Problem List Items Addressed This Visit       Other   Mixed stress and urge urinary incontinence    Kegel exercises advised Started  oxybutynin      Relevant Medications   oxybutynin (DITROPAN XL) 10 MG 24 hr tablet   Chest pain    EKG: Sinus rhythm.  No signs of active ischemia. Considering her low risk profile, chest pain does not seem to be cardiac related. Chest discomfort likely related to GERD, advised to try Pepcid as needed for now      Relevant Orders   EKG 12-Lead (Completed)   Other Visit Diagnoses     Trapezius strain, left, initial encounter    -  Primary Neck discomfort likely due to trapezius strain Advised to perform simple neck exercises Flexeril as needed Tylenol or ibuprofen as needed for pain   Relevant Medications   cyclobenzaprine (FLEXERIL) 5 MG tablet   Non-recurrent acute serous otitis media of left ear     Left ear pain likely due to acute otitis media Started Azithromycin If persistent concern, will refer to ENT   Relevant Medications   azithromycin (ZITHROMAX) 250 MG tablet   Acute pain of left shoulder     Could be related to referred pain from neck She also c/o popping sounds from shoulder area, could be rotator cuff injury Referred to Orthopedic surgery   Relevant Orders   Ambulatory referral to Orthopedic Surgery       Meds ordered this  encounter  Medications   cyclobenzaprine (FLEXERIL) 5 MG tablet    Sig: Take 1 tablet (5 mg total) by mouth 3 (three) times daily as needed for muscle spasms.    Dispense:  30 tablet    Refill:  1   azithromycin (ZITHROMAX) 250 MG tablet    Sig: Take 2 tablets on day 1, then 1 tablet daily on days 2 through 5    Dispense:  6 tablet    Refill:  0   oxybutynin (DITROPAN XL) 10 MG 24 hr tablet    Sig: Take 1 tablet (10 mg total) by mouth at bedtime.    Dispense:  30 tablet    Refill:  5    Follow-up: Return if symptoms worsen or fail to improve.    Lindell Spar, MD

## 2021-07-19 NOTE — Assessment & Plan Note (Signed)
Kegel exercises advised Started oxybutynin

## 2021-07-19 NOTE — Assessment & Plan Note (Signed)
EKG: Sinus rhythm.  No signs of active ischemia. Considering her low risk profile, chest pain does not seem to be cardiac related. Chest discomfort likely related to GERD, advised to try Pepcid as needed for now

## 2021-07-29 ENCOUNTER — Ambulatory Visit: Payer: 59

## 2021-07-29 ENCOUNTER — Encounter: Payer: Self-pay | Admitting: Orthopedic Surgery

## 2021-07-29 ENCOUNTER — Other Ambulatory Visit: Payer: Self-pay

## 2021-07-29 ENCOUNTER — Ambulatory Visit: Payer: 59 | Admitting: Orthopedic Surgery

## 2021-07-29 ENCOUNTER — Other Ambulatory Visit (HOSPITAL_COMMUNITY): Payer: Self-pay

## 2021-07-29 VITALS — BP 135/69 | HR 75 | Ht 67.0 in | Wt 144.8 lb

## 2021-07-29 DIAGNOSIS — M25512 Pain in left shoulder: Secondary | ICD-10-CM

## 2021-07-29 DIAGNOSIS — M7582 Other shoulder lesions, left shoulder: Secondary | ICD-10-CM

## 2021-07-29 MED ORDER — IBUPROFEN 600 MG PO TABS
600.0000 mg | ORAL_TABLET | Freq: Four times a day (QID) | ORAL | 0 refills | Status: AC | PRN
  Filled 2021-07-29: qty 30, 8d supply, fill #0

## 2021-07-29 NOTE — Progress Notes (Signed)
New Patient Visit ? ?Assessment: ?Christine Marquez is a 61 y.o. female with the following: ?1. Rotator cuff tendinitis, left ? ?Plan: ?Christine Marquez has pain in her left shoulder.  Atraumatic onset.  She has pain in the lateral and posterior aspect of the left shoulder, with excellent range of motion.  She also has good strength.  She likely has some irritation of the rotator cuff tendons.  On radiographs, she has mild degenerative changes, including the AC joint.  She has no pain at the [place] joint.  The muscular pain she is experiencing within her trapezius, and the left side of her neck is likely a response to the irritation within her shoulder.  We discussed multiple treatment options at this time.  She is elected proceed with an updated prescription for ibuprofen, and home exercises.  If she continues to have issues, we can consider formal physical therapy.  We can also consider a subacromial injection.  All questions were answered, she is amenable this plan.  Follow-up as needed. ? ? ?Follow-up: ?Return if symptoms worsen or fail to improve. ? ?Subjective: ? ?Chief Complaint  ?Patient presents with  ? New Patient (Initial Visit)  ? Shoulder Pain  ?  LT shoulder across to neck and pain radiates down arm. Has been painful x 1 month  ? ? ?History of Present Illness: ?Christine Marquez is a 61 y.o. RHD female who has been referred to clinic today by Ihor Dow, MD for evaluation of left shoulder pain.  She is a Marine scientist, who works at [employer].  She has been dealing with pain in her left shoulder for the past month.  She denies a specific injury.  She is never hurt her left shoulder before.  Pain started in the lateral posterior aspect of the left shoulder.  From there, she started develop some pain, stiffness and tenderness within the trapezius muscle, as well as the left side of her neck.  She also notes some pain radiating distally from her shoulder.  She is taking ibuprofen as needed.  She has not worked with a  physical therapist.  No prior injections. ? ? ?Review of Systems: ?No fevers or chills ?No numbness or tingling ?No chest pain ?No shortness of breath ?No bowel or bladder dysfunction ?No GI distress ?No headaches ? ? ?Medical History: ? ?History reviewed. No pertinent past medical history. ? ?Past Surgical History:  ?Procedure Laterality Date  ? ABDOMINAL HYSTERECTOMY    ? HEMORRHOID SURGERY    ? ? ?Family History  ?Problem Relation Age of Onset  ? Heart failure Father   ? Diabetes Other   ? Cancer Maternal Grandmother   ? Cancer Maternal Grandfather   ? ?Social History  ? ?Tobacco Use  ? Smoking status: Former  ?  Types: Cigarettes  ?  Quit date: 05/22/2013  ?  Years since quitting: 8.1  ? Smokeless tobacco: Never  ?Substance Use Topics  ? Alcohol use: No  ? Drug use: No  ? ? ?Allergies  ?Allergen Reactions  ? Penicillins Hives  ? ? ?Current Meds  ?Medication Sig  ? acetaminophen (TYLENOL) 325 MG tablet Take 650 mg by mouth every 6 (six) hours as needed for moderate pain.  ? cyclobenzaprine (FLEXERIL) 5 MG tablet Take 1 tablet (5 mg total) by mouth 3 (three) times daily as needed for muscle spasms.  ? doxycycline (VIBRAMYCIN) 50 MG capsule Take 1 capsule by mouth once daily with food and water as directed. Use sun protection.  ?  hydrOXYzine (VISTARIL) 25 MG capsule Take 1 capsule (25 mg total) by mouth at bedtime as needed for anxiety (Insmonia).  ? ibuprofen (ADVIL) 600 MG tablet Take 1 tablet (600 mg total) by mouth every 6 (six) hours as needed.  ? metroNIDAZOLE (METROCREAM) 0.75 % cream Apply 1 application on the skin twice a day; Apply dime-size amount to entire face  ? oxybutynin (DITROPAN XL) 10 MG 24 hr tablet Take 1 tablet (10 mg total) by mouth at bedtime.  ? valACYclovir (VALTREX) 1000 MG tablet TAKE 2 TABLETS BY MOUTH 2 TIMES DAILY FOR 1 DAY  ? ? ?Objective: ?BP 135/69   Pulse 75   Ht '5\' 7"'$  (1.702 m)   Wt 144 lb 12.8 oz (65.7 kg)   BMI 22.68 kg/m?  ? ?Physical Exam: ? ?General: Alert and oriented.  and No acute distress. ?Gait: Normal gait. ? ?Evaluation of left shoulder demonstrates no deformity.  No atrophy is appreciated.  She has near full range of motion in the left shoulder.  Internal rotation to T12.  She does have some tenderness to palpation within the left trapezius muscle, as well as the left side of her neck.  No tenderness to palpation over the [facility] joint.  Supraspinatus, infraspinatus and subscapularis strength testing is 5/5.  Fingers are warm and well-perfused.  Sensation is intact throughout the left hand. ? ?IMAGING: ?I personally ordered and reviewed the following images ? ?X-rays of the left shoulder were obtained in clinic today.  No acute injuries are noted.  Degenerative changes at the [facility] joint, including loss of joint space and some associated osteophytes.  No evidence of proximal humeral migration.  Mild loss of joint space within the glenohumeral joint. ? ?Impression: Left shoulder x-ray without acute injury; moderate AC joint arthrosis ? ? ?New Medications:  ?Meds ordered this encounter  ?Medications  ? ibuprofen (ADVIL) 600 MG tablet  ?  Sig: Take 1 tablet (600 mg total) by mouth every 6 (six) hours as needed.  ?  Dispense:  30 tablet  ?  Refill:  0  ? ? ? ? ?Mordecai Rasmussen, MD ? ?07/29/2021 ?3:53 PM ? ? ?

## 2021-07-29 NOTE — Patient Instructions (Signed)

## 2021-09-05 ENCOUNTER — Other Ambulatory Visit (HOSPITAL_COMMUNITY): Payer: Self-pay

## 2021-10-21 ENCOUNTER — Other Ambulatory Visit (HOSPITAL_COMMUNITY): Payer: Self-pay

## 2021-10-22 ENCOUNTER — Other Ambulatory Visit (HOSPITAL_COMMUNITY): Payer: Self-pay

## 2021-10-22 ENCOUNTER — Other Ambulatory Visit (HOSPITAL_BASED_OUTPATIENT_CLINIC_OR_DEPARTMENT_OTHER): Payer: Self-pay

## 2021-10-22 ENCOUNTER — Ambulatory Visit
Admission: EM | Admit: 2021-10-22 | Discharge: 2021-10-22 | Disposition: A | Discharge status: Home / Self Care | Payer: 59 | Attending: Family Medicine | Admitting: Family Medicine

## 2021-10-22 DIAGNOSIS — J069 Acute upper respiratory infection, unspecified: Secondary | ICD-10-CM | POA: Diagnosis not present

## 2021-10-22 DIAGNOSIS — R509 Fever, unspecified: Secondary | ICD-10-CM

## 2021-10-22 MED ORDER — ALBUTEROL SULFATE HFA 108 (90 BASE) MCG/ACT IN AERS: 1.0000 | INHALATION_SPRAY | Freq: Four times a day (QID) | RESPIRATORY_TRACT | 0 refills | Status: DC | PRN

## 2021-10-22 MED ORDER — PROMETHAZINE-DM 6.25-15 MG/5ML PO SYRP: 5.0000 mL | ORAL_SOLUTION | Freq: Four times a day (QID) | ORAL | 0 refills | Status: DC | PRN

## 2021-10-22 MED ORDER — MOLNUPIRAVIR EUA 200MG CAPSULE
4.0000 | ORAL_CAPSULE | Freq: Two times a day (BID) | ORAL | 0 refills | Status: AC
  Filled 2021-10-22: qty 40, 5d supply, fill #0

## 2021-10-22 NOTE — ED Triage Notes (Signed)
Pt states that on Wednesday she lost her voice, sore throat and she had a tight cough  Pt states she is unable to catch a deep breath  Pt states she has extreme fatigue and some indigestion  Pt states she took 2 Covid tests and they were negative  Pt states she took some tylenol because she had a fever last night

## 2021-10-22 NOTE — ED Provider Notes (Signed)
[facility]    CSN: [id number] Arrival date & time: 10/22/21  0807      History   Chief Complaint Chief Complaint  Patient presents with   Fever    Fever, fatigue and SOB    HPI Christine Marquez is a 61 y.o. female.   Presenting today with 3-day history of hoarseness, sore throat, chest tightness, pain with coughing, fatigue, body aches, fever, chills, sweats.  Denies nausea, vomiting, diarrhea, rashes.  Taking Tylenol with mild temporary relief of symptoms.  Negative home COVID test x2.  No history of chronic pulmonary disease but has had pneumonia in the past.  Works at the hospital so lots of sick contacts.   History reviewed. No pertinent past medical history.  Patient Active Problem List   Diagnosis Date Noted   Mixed stress and urge urinary incontinence 07/19/2021   Chest pain 07/19/2021   Encounter for general adult medical examination with abnormal findings 03/09/2021   Primary osteoarthritis of both hands 03/09/2021   HSV (herpes simplex virus) infection 09/19/2019   Primary insomnia 09/19/2019   Prehypertension 09/19/2019    Past Surgical History:  Procedure Laterality Date   ABDOMINAL HYSTERECTOMY     HEMORRHOID SURGERY      OB History     Gravida  2   Para  2   Term  2   Preterm      AB      Living         SAB      IAB      Ectopic      Multiple      Live Births               Home Medications    Prior to Admission medications   Medication Sig Start Date End Date Taking? Authorizing Provider  albuterol (VENTOLIN HFA) 108 (90 Base) MCG/ACT inhaler Inhale 1-2 puffs into the lungs every 6 (six) hours as needed for wheezing or shortness of breath. 10/22/21  Yes Volney American, PA-C  molnupiravir EUA (LAGEVRIO) 200 mg CAPS capsule Take 4 capsules (800 mg total) by mouth 2 (two) times daily for 5 days. 10/22/21 10/27/21 Yes Volney American, PA-C  promethazine-dextromethorphan (PROMETHAZINE-DM) 6.25-15 MG/5ML syrup  Take 5 mLs by mouth 4 (four) times daily as needed. 10/22/21  Yes Volney American, PA-C  acetaminophen (TYLENOL) 325 MG tablet Take 650 mg by mouth every 6 (six) hours as needed for moderate pain.    [provider]  cyclobenzaprine (FLEXERIL) 5 MG tablet Take 1 tablet (5 mg total) by mouth 3 (three) times daily as needed for muscle spasms. 07/19/21   Lindell Spar, MD  doxycycline (VIBRAMYCIN) 50 MG capsule Take 1 capsule by mouth once daily with food and water as directed. Use sun protection. 03/14/21     hydrOXYzine (VISTARIL) 25 MG capsule Take 1 capsule (25 mg total) by mouth at bedtime as needed for anxiety (Insmonia). 03/09/21   Lindell Spar, MD  ibuprofen (ADVIL) 600 MG tablet Take 1 tablet (600 mg total) by mouth every 6 (six) hours as needed. 07/29/21   Mordecai Rasmussen, MD  metroNIDAZOLE (METROCREAM) 0.75 % cream Apply 1 application on the skin twice a day; Apply dime-size amount to entire face 01/07/21     oxybutynin (DITROPAN XL) 10 MG 24 hr tablet Take 1 tablet (10 mg total) by mouth at bedtime. 07/19/21   Lindell Spar, MD  valACYclovir (VALTREX) 1000 MG tablet  TAKE 2 TABLETS BY MOUTH 2 TIMES DAILY FOR 1 DAY 07/19/21 07/19/22  Lindell Spar, MD    Family History Family History  Problem Relation Age of Onset   Heart failure Father    Diabetes Other    Cancer Maternal Grandmother    Cancer Maternal Grandfather     Social History Social History   Tobacco Use   Smoking status: Former    Types: Cigarettes    Quit date: 05/22/2013    Years since quitting: 8.4   Smokeless tobacco: Never  Vaping Use   Vaping Use: Never used  Substance Use Topics   Alcohol use: No   Drug use: No     Allergies   Penicillins   Review of Systems Review of Systems Per HPI  Physical Exam Triage Vital Signs ED Triage Vitals  Enc Vitals Group     BP 10/22/21 0825 114/72     Pulse Rate 10/22/21 0825 (!) 101     Resp 10/22/21 0825 20     Temp 10/22/21 0825 99.7 F (37.6  C)     Temp Source 10/22/21 0825 Oral     SpO2 10/22/21 0825 95 %     Weight --      Height --      Head Circumference --      Peak Flow --      Pain Score 10/22/21 0822 7     Pain Loc --      Pain Edu? --      Excl. in [place]? --    No data found.  Updated Vital Signs BP 114/72 (BP Location: Right Arm)   Pulse (!) 101   Temp 99.7 F (37.6 C) (Oral)   Resp 20   SpO2 95%   Visual Acuity Right Eye Distance:   Left Eye Distance:   Bilateral Distance:    Right Eye Near:   Left Eye Near:    Bilateral Near:     Physical Exam Vitals and nursing note reviewed.  Constitutional:      Appearance: Normal appearance.  HENT:     Head: Atraumatic.     Right Ear: Tympanic membrane and external ear normal.     Left Ear: Tympanic membrane and external ear normal.     Nose: Rhinorrhea present.     Mouth/Throat:     Mouth: Mucous membranes are moist.     Pharynx: Posterior oropharyngeal erythema present.  Eyes:     Extraocular Movements: Extraocular movements intact.     Conjunctiva/sclera: Conjunctivae normal.  Cardiovascular:     Rate and Rhythm: Normal rate and regular rhythm.     Heart sounds: Normal heart sounds.  Pulmonary:     Effort: Pulmonary effort is normal.     Breath sounds: Normal breath sounds. No wheezing or rales.  Musculoskeletal:        General: Normal range of motion.     Cervical back: Normal range of motion and neck supple.  Skin:    General: Skin is warm and dry.  Neurological:     Mental Status: She is alert and oriented to person, place, and time.  Psychiatric:        Mood and Affect: Mood normal.        Thought Content: Thought content normal.     UC Treatments / Results  Labs (all labs ordered are listed, but only abnormal results are displayed) Labs Reviewed  COVID-19, FLU A+B NAA    EKG   Radiology  No results found.  Procedures Procedures (including critical care time)  Medications Ordered in UC Medications - No data to  display  Initial Impression / Assessment and Plan / UC Course  I have reviewed the triage vital signs and the nursing notes.  Pertinent labs & imaging results that were available during my care of the patient were reviewed by me and considered in my medical decision making (see chart for details).     Strong suspicion for COVID-19, COVID and flu pending, treat with molnupiravir while awaiting results, albuterol, Phenergan DM, supportive the counter medications and home care.  Work note given.  Return for worsening symptoms.  Final Clinical Impressions(s) / UC Diagnoses   Final diagnoses:  Viral URI with cough  Fever, unspecified   Discharge Instructions   None    ED Prescriptions     Medication Sig Dispense Auth. Provider   molnupiravir EUA (LAGEVRIO) 200 mg CAPS capsule Take 4 capsules (800 mg total) by mouth 2 (two) times daily for 5 days. 40 capsule Volney American, [place]   albuterol (VENTOLIN HFA) 108 (90 Base) MCG/ACT inhaler Inhale 1-2 puffs into the lungs every 6 (six) hours as needed for wheezing or shortness of breath. [place], [place]   promethazine-dextromethorphan (PROMETHAZINE-DM) 6.25-15 MG/5ML syrup Take 5 mLs by mouth 4 (four) times daily as needed. 100 mL Volney American, [place]      PDMP not reviewed this encounter.   Merrie Roof [place], [place] 10/22/21 [phone]

## 2021-10-23 LAB — COVID-19, FLU A+B NAA
Influenza A, NAA: NOT DETECTED
Influenza B, NAA: NOT DETECTED
SARS-CoV-2, NAA: NOT DETECTED

## 2021-10-24 ENCOUNTER — Other Ambulatory Visit (HOSPITAL_COMMUNITY): Payer: Self-pay

## 2021-10-24 MED ORDER — DOXYCYCLINE HYCLATE 50 MG PO CAPS
ORAL_CAPSULE | ORAL | 1 refills | Status: DC
  Filled 2021-10-24: qty 30, 30d supply, fill #0
  Filled 2021-12-16: qty 30, 30d supply, fill #1

## 2021-10-25 ENCOUNTER — Encounter: Payer: Self-pay | Admitting: Family Medicine

## 2021-10-25 ENCOUNTER — Ambulatory Visit: Payer: 59 | Admitting: Family Medicine

## 2021-10-25 ENCOUNTER — Other Ambulatory Visit (HOSPITAL_COMMUNITY): Payer: Self-pay

## 2021-10-25 DIAGNOSIS — R051 Acute cough: Secondary | ICD-10-CM | POA: Diagnosis not present

## 2021-10-25 MED ORDER — METHYLPREDNISOLONE 4 MG PO TBPK: ORAL_TABLET | ORAL | 0 refills | Status: DC

## 2021-10-25 MED ORDER — BENZONATATE 100 MG PO CAPS: 100.0000 mg | ORAL_CAPSULE | Freq: Two times a day (BID) | ORAL | 0 refills | Status: DC | PRN

## 2021-10-25 NOTE — Progress Notes (Signed)
Virtual Visit via Telephone Note   This visit type was conducted due to national recommendations for restrictions regarding the COVID-19 Pandemic (e.g. social distancing) in an effort to limit this patient's exposure and mitigate transmission in our community.  Due to her co-morbid illnesses, this patient is at least at moderate risk for complications without adequate follow up.  This format is felt to be most appropriate for this patient at this time.  The patient did not have access to video technology/had technical difficulties with video requiring transitioning to audio format only (telephone).  All issues noted in this document were discussed and addressed.  No physical exam could be performed with this format.  Please refer to the patient's chart for her  consent to telehealth for [facility].   Evaluation Performed:  Follow-up visit  Date:  10/25/2021   ID:  Christine Marquez, DOB 12-Feb-1961, MRN [id number]  Patient Location: Home Provider Location: Office/Clinic  Participants: Patient Location of Patient: Home Location of Provider: Telehealth Consent was obtain for visit to be over via telehealth. I verified that I am speaking with the correct person using two identifiers.  PCP:  Lindell Spar, MD   Chief Complaint:  cough  History of Present Illness:   Christine Marquez is a 61 y.o. female with who was seen in the ED on 10/22/21 with c/o of hoarseness, sore throat, chest tightness, pain with coughing, fatigue, body aches, fever, chills, and sweats. She was noted to have negative flu and covid testing . She was treated with a 5 days course of molnupiravir and given albuterol and promethazine-DM. The patient reports mild relief of symptoms with 1 day left of her antiviral treatment. She mentions occasional low-grade fever and bouts of severe, harsh cough. She reports still having pain with coughing, specifically at the intercostal muscle.   The patient does not have symptoms  concerning for COVID-19 infection (fever, chills, cough, or new shortness of breath).   Past Medical, Surgical, Social History, Allergies, and Medications have been Reviewed.  History reviewed. No pertinent past medical history. Past Surgical History:  Procedure Laterality Date   ABDOMINAL HYSTERECTOMY     HEMORRHOID SURGERY       Current Meds  Medication Sig   acetaminophen (TYLENOL) 325 MG tablet Take 650 mg by mouth every 6 (six) hours as needed for moderate pain.   albuterol (VENTOLIN HFA) 108 (90 Base) MCG/ACT inhaler Inhale 1-2 puffs into the lungs every 6 (six) hours as needed for wheezing or shortness of breath.   cyclobenzaprine (FLEXERIL) 5 MG tablet Take 1 tablet (5 mg total) by mouth 3 (three) times daily as needed for muscle spasms.   doxycycline (VIBRAMYCIN) 50 MG capsule Take 1 capsule by mouth once daily with food and water as directed. **Use sun protection.   hydrOXYzine (VISTARIL) 25 MG capsule Take 1 capsule (25 mg total) by mouth at bedtime as needed for anxiety (Insmonia).   ibuprofen (ADVIL) 600 MG tablet Take 1 tablet (600 mg total) by mouth every 6 (six) hours as needed.   metroNIDAZOLE (METROCREAM) 0.75 % cream Apply 1 application on the skin twice a day; Apply dime-size amount to entire face   molnupiravir EUA (LAGEVRIO) 200 mg CAPS capsule Take 4 capsules (800 mg total) by mouth 2 (two) times daily for 5 days.   oxybutynin (DITROPAN XL) 10 MG 24 hr tablet Take 1 tablet (10 mg total) by mouth at bedtime.   promethazine-dextromethorphan (PROMETHAZINE-DM) 6.25-15 MG/5ML syrup Take  5 mLs by mouth 4 (four) times daily as needed.   valACYclovir (VALTREX) 1000 MG tablet TAKE 2 TABLETS BY MOUTH 2 TIMES DAILY FOR 1 DAY     Allergies:   Penicillins   ROS:   Please see the history of present illness.     All other systems reviewed and are negative.   Labs/Other Tests and Data Reviewed:    Recent Labs: 03/09/2021: ALT 19; BUN 13; Creatinine, Ser 0.68; Hemoglobin  13.7; Platelets 235; Potassium 4.7; Sodium 142; TSH 0.834   Recent Lipid Panel Lab Results  Component Value Date/Time   CHOL 188 03/09/2021 09:56 AM   TRIG 57 03/09/2021 09:56 AM   HDL 92 03/09/2021 09:56 AM   CHOLHDL 2.0 03/09/2021 09:56 AM   CHOLHDL 2.2 11/19/2019 11:51 AM   LDLCALC 85 03/09/2021 09:56 AM   LDLCALC 98 11/19/2019 11:51 AM    Wt Readings from Last 3 Encounters:  07/29/21 144 lb 12.8 oz (65.7 kg)  07/19/21 141 lb 6.4 oz (64.1 kg)  03/09/21 143 lb (64.9 kg)     Objective:    Vital Signs:  There were no vitals taken for this visit.     ASSESSMENT & PLAN:   Cough -with treat with a short dose of medrol dosepak and tessalon -recommended supportive treatment and symptomatic management of URI symptoms -encouraged to stay  well hydrated  -advised pt that intercostal pain will subside with coughing,  -intercoastal pain is likely from the coughing -advise her to continue taking Motrin as needed   Time:   Today, I have spent 11 minutes reviewing the chart, including problem list, medications, and with the patient with telehealth technology discussing the above problems.   Medication Adjustments/Labs and Tests Ordered: Current medicines are reviewed at length with the patient today.  Concerns regarding medicines are outlined above.   Tests Ordered: No orders of the defined types were placed in this encounter.   Medication Changes: No orders of the defined types were placed in this encounter.          Disposition:  Follow up as needed Signed, Alvira Monday, FNP  10/25/2021 11:26 AM     [facility]

## 2021-11-17 ENCOUNTER — Other Ambulatory Visit (HOSPITAL_COMMUNITY): Payer: Self-pay

## 2021-12-16 ENCOUNTER — Other Ambulatory Visit (HOSPITAL_COMMUNITY): Payer: Self-pay

## 2022-01-25 DIAGNOSIS — H47093 Other disorders of optic nerve, not elsewhere classified, bilateral: Secondary | ICD-10-CM | POA: Diagnosis not present

## 2022-01-25 DIAGNOSIS — H2513 Age-related nuclear cataract, bilateral: Secondary | ICD-10-CM | POA: Diagnosis not present

## 2022-02-22 ENCOUNTER — Other Ambulatory Visit: Payer: Self-pay | Admitting: Internal Medicine

## 2022-02-22 ENCOUNTER — Encounter: Payer: Self-pay | Admitting: Internal Medicine

## 2022-02-22 ENCOUNTER — Ambulatory Visit: Payer: 59 | Admitting: Internal Medicine

## 2022-02-22 VITALS — BP 138/78 | HR 85 | Resp 18 | Ht 67.0 in | Wt 144.6 lb

## 2022-02-22 DIAGNOSIS — R0609 Other forms of dyspnea: Secondary | ICD-10-CM

## 2022-02-22 DIAGNOSIS — R079 Chest pain, unspecified: Secondary | ICD-10-CM | POA: Diagnosis not present

## 2022-02-22 DIAGNOSIS — Z1231 Encounter for screening mammogram for malignant neoplasm of breast: Secondary | ICD-10-CM

## 2022-02-22 NOTE — Patient Instructions (Signed)
Please get CT of coronary and CT chest done as scheduled.

## 2022-02-23 NOTE — Assessment & Plan Note (Addendum)
Unclear etiology, non-smoker Progressive dyspnea upon minimal exertion Check CT chest EKG showed signs of RAE - ?pulmonary etiology

## 2022-02-23 NOTE — Progress Notes (Signed)
Established Patient Office Visit  Subjective:  Patient ID: Christine Marquez, female    DOB: [birth year]  Age: 60 y.o. MRN: [id number]  CC:  Chief Complaint  Patient presents with   Follow-up    Pt still having pain in chest to back thought heart burn took tums and rolaids this does not help at all pt is really sob this has been going on since last visit 2/28 but getting worse     HPI Christine Marquez is a 61 y.o. female with past medical history of rosacea and insomnia who presents for c/o chest pain.  She complains of of left-sided upper chest wall pain, which is intermittent, sharp, radiates towards her back and is unrelated to activity status.  She also reports dyspnea upon minimal exertion.  Denies any LE edema.  Denies any dizziness, diaphoresis or palpitations.  She has tried taking OTC antacids without much relief.  In the last visit, we had done EKG, which showed possible right atrial enlargement, but no signs of active ischemia.  She has lean body mass.  Her BP is WNL.  Her cholesterol profile has been WNL.    History reviewed. No pertinent past medical history.  Past Surgical History:  Procedure Laterality Date   ABDOMINAL HYSTERECTOMY     HEMORRHOID SURGERY      Family History  Problem Relation Age of Onset   Heart failure Father    Diabetes Other    Cancer Maternal Grandmother    Cancer Maternal Grandfather     Social History   Socioeconomic History   Marital status: Divorced    Spouse name: Not on file   Number of children: 2   Years of education: Not on file   Highest education level: Associate degree: academic program  Occupational History    Comment: RN   Tobacco Use   Smoking status: Former    Types: Cigarettes    Quit date: 05/22/2013    Years since quitting: 8.7   Smokeless tobacco: Never  Vaping Use   Vaping Use: Never used  Substance and Sexual Activity   Alcohol use: No   Drug use: No   Sexual activity: Not Currently    Birth  control/protection: Surgical  Other Topics Concern   Not on file  Social History Narrative   Lives with youngest son who is disabled- had a TBI first year of college   Oldest lives close by-3 grandchildren      Dog: Bell   2 cats: Peppers, Bean       Enjoys: off time with youngest grandson that is 58 years old, weight lifting, planks, and core with walk      Diet: eats all food groups   Caffeine: 1 cup coffee daily at home, at work 3 cups daily   Water: 1 cup daily- needs to work on that      Wears seat belt    Does not use phone while driving    Some detectors at home   Social Determinants of Health   Financial Resource Strain: [place]  (09/19/2019)   Overall Financial Resource Strain (CARDIA)    Difficulty of Paying Living Expenses: Not hard at all  Food Insecurity: No [place] (09/19/2019)   Hunger Vital Sign    Worried About Running Out of Food in the Last Year: Never true    [place] in the Last Year: Never true  Transportation Needs: No Transportation Needs (09/19/2019)   PRAPARE -  Hydrologist (Medical): No    Lack of Transportation (Non-Medical): No  Physical Activity: Insufficiently Active (09/19/2019)   Exercise Vital Sign    Days of Exercise per Week: 4 days    Minutes of Exercise per Session: 30 min  Stress: No Stress Concern Present (09/19/2019)   [place]    Feeling of Stress : Only a little  Social Connections: Socially Isolated (09/19/2019)   Social Connection and Isolation Panel [NHANES]    Frequency of Communication with Friends and Family: More than three times a week    Frequency of Social Gatherings with Friends and Family: More than three times a week    Attends Religious Services: Never    Marine scientist or Organizations: No    Attends Archivist Meetings: Never    Marital Status: Divorced  Human resources officer Violence: Not At  Risk (09/19/2019)   Humiliation, Afraid, Rape, and Kick questionnaire    Fear of Current or Ex-Partner: No    Emotionally Abused: No    Physically Abused: No    Sexually Abused: No    Outpatient Medications Prior to Visit  Medication Sig Dispense Refill   acetaminophen (TYLENOL) 325 MG tablet Take 650 mg by mouth every 6 (six) hours as needed for moderate pain.     albuterol (VENTOLIN HFA) 108 (90 Base) MCG/ACT inhaler Inhale 1-2 puffs into the lungs every 6 (six) hours as needed for wheezing or shortness of breath. 18 g 0   benzonatate (TESSALON) 100 MG capsule Take 1 capsule (100 mg total) by mouth 2 (two) times daily as needed for cough. 20 capsule 0   cyclobenzaprine (FLEXERIL) 5 MG tablet Take 1 tablet (5 mg total) by mouth 3 (three) times daily as needed for muscle spasms. 30 tablet 1   doxycycline (VIBRAMYCIN) 50 MG capsule Take 1 capsule by mouth once daily with food and water as directed. **Use sun protection. 30 capsule 1   hydrOXYzine (VISTARIL) 25 MG capsule Take 1 capsule (25 mg total) by mouth at bedtime as needed for anxiety (Insmonia). 30 capsule 4   ibuprofen (ADVIL) 600 MG tablet Take 1 tablet (600 mg total) by mouth every 6 (six) hours as needed. 30 tablet 0   oxybutynin (DITROPAN XL) 10 MG 24 hr tablet Take 1 tablet (10 mg total) by mouth at bedtime. 30 tablet 5   valACYclovir (VALTREX) 1000 MG tablet TAKE 2 TABLETS BY MOUTH 2 TIMES DAILY FOR 1 DAY 8 tablet 0   methylPREDNISolone (MEDROL DOSEPAK) 4 MG TBPK tablet Take as the package instructed 1 each 0   promethazine-dextromethorphan (PROMETHAZINE-DM) 6.25-15 MG/5ML syrup Take 5 mLs by mouth 4 (four) times daily as needed. 100 mL 0   metroNIDAZOLE (METROCREAM) 0.75 % cream Apply on the skin twice a day; Apply dime-size amount to entire face (Patient not taking: Reported on 02/22/2022) 45 g 11   No facility-administered medications prior to visit.    Allergies  Allergen Reactions   Penicillins Hives    ROS Review of  Systems  Constitutional:  Positive for fatigue. Negative for chills and fever.  HENT:  Negative for congestion and sore throat.   Respiratory:  Positive for shortness of breath. Negative for cough.   Cardiovascular:  Positive for chest pain. Negative for palpitations.  Gastrointestinal:  Negative for nausea and vomiting.  Genitourinary:  Negative for dysuria and hematuria.  Musculoskeletal:  Positive for neck pain. Negative for  neck stiffness.  Skin:  Negative for rash.  Neurological:  Negative for dizziness and weakness.  Psychiatric/Behavioral:  Negative for agitation and behavioral problems.       Objective:    Physical Exam Constitutional:      General: She is not in acute distress.    Appearance: She is not diaphoretic.  HENT:     Head: Normocephalic and atraumatic.     Nose: Nose normal.     Mouth/Throat:     Mouth: Mucous membranes are moist.     Pharynx: No posterior oropharyngeal erythema.  Eyes:     General: No scleral icterus.    Extraocular Movements: Extraocular movements intact.  Cardiovascular:     Rate and Rhythm: Normal rate and regular rhythm.     Heart sounds: Normal heart sounds. No murmur heard. Pulmonary:     Breath sounds: Normal breath sounds. No wheezing or rales.  Chest:     Chest wall: No tenderness.  Musculoskeletal:     Cervical back: Neck supple. No tenderness.     Right lower leg: No edema.     Left lower leg: No edema.  Skin:    General: Skin is warm.     Findings: No rash.  Neurological:     General: No focal deficit present.     Mental Status: She is alert and oriented to person, place, and time.     Sensory: No sensory deficit.     Motor: No weakness.  Psychiatric:        Mood and Affect: Mood normal.        Behavior: Behavior normal.     BP 138/78 (BP Location: Right Arm, Patient Position: Sitting, Cuff Size: Normal)   Pulse 85   Resp 18   Ht 5' 7"  (1.702 m)   Wt 144 lb 9.6 oz (65.6 kg)   SpO2 97%   BMI 22.65 kg/m  Wt  Readings from Last 3 Encounters:  02/22/22 144 lb 9.6 oz (65.6 kg)  07/29/21 144 lb 12.8 oz (65.7 kg)  07/19/21 141 lb 6.4 oz (64.1 kg)    Lab Results  Component Value Date   TSH 0.834 03/09/2021   Lab Results  Component Value Date   WBC 4.6 03/09/2021   HGB 13.7 03/09/2021   HCT 40.2 03/09/2021   MCV 94 03/09/2021   PLT 235 03/09/2021   Lab Results  Component Value Date   NA 142 03/09/2021   K 4.7 03/09/2021   CO2 26 03/09/2021   GLUCOSE 75 03/09/2021   BUN 13 03/09/2021   CREATININE 0.68 03/09/2021   BILITOT 0.4 03/09/2021   ALKPHOS 130 (H) 03/09/2021   AST 26 03/09/2021   ALT 19 03/09/2021   PROT 7.2 03/09/2021   ALBUMIN 5.0 (H) 03/09/2021   CALCIUM 9.7 03/09/2021   ANIONGAP 9 11/07/2017   EGFR 98 03/09/2021   Lab Results  Component Value Date   CHOL 188 03/09/2021   Lab Results  Component Value Date   HDL 92 03/09/2021   Lab Results  Component Value Date   LDLCALC 85 03/09/2021   Lab Results  Component Value Date   TRIG 57 03/09/2021   Lab Results  Component Value Date   CHOLHDL 2.0 03/09/2021   Lab Results  Component Value Date   HGBA1C 5.4 03/09/2021      Assessment & Plan:   Problem List Items Addressed This Visit       Other   Chest pain - Primary  EKG (02/23): Sinus rhythm.  No signs of active ischemia. Considering her low risk profile, will get coronary CT Has tried OTC antacid without much relief Now has dyspnea upon minimal exertion as well Referred to Cardiology      Relevant Orders   Ambulatory referral to Cardiology   Dyspnea on exertion    Unclear etiology, non-smoker Progressive dyspnea upon minimal exertion Check CT chest EKG showed signs of RAE - ?pulmonary etiology      Relevant Orders   Ambulatory referral to Cardiology   CT Chest Wo Contrast   CT CARDIAC SCORING (SELF PAY ONLY)    No orders of the defined types were placed in this encounter.   Follow-up: Return if symptoms worsen or fail to improve.     Lindell Spar, MD

## 2022-02-23 NOTE — Assessment & Plan Note (Addendum)
EKG (02/23): Sinus rhythm.  No signs of active ischemia. Considering her low risk profile, will get coronary CT Has tried OTC antacid without much relief Now has dyspnea upon minimal exertion as well Referred to Cardiology

## 2022-03-03 ENCOUNTER — Ambulatory Visit
Admission: RE | Admit: 2022-03-03 | Discharge: 2022-03-03 | Disposition: A | Discharge status: Home / Self Care | Payer: No Typology Code available for payment source | Source: Ambulatory Visit | Attending: Internal Medicine | Admitting: Internal Medicine

## 2022-03-03 ENCOUNTER — Ambulatory Visit
Admission: RE | Admit: 2022-03-03 | Discharge: 2022-03-03 | Disposition: A | Discharge status: Home / Self Care | Payer: 59 | Source: Ambulatory Visit | Attending: Internal Medicine | Admitting: Internal Medicine

## 2022-03-03 DIAGNOSIS — I700 Atherosclerosis of aorta: Secondary | ICD-10-CM | POA: Diagnosis not present

## 2022-03-03 DIAGNOSIS — E7800 Pure hypercholesterolemia, unspecified: Secondary | ICD-10-CM | POA: Diagnosis not present

## 2022-03-03 DIAGNOSIS — J479 Bronchiectasis, uncomplicated: Secondary | ICD-10-CM | POA: Diagnosis not present

## 2022-03-03 DIAGNOSIS — R0609 Other forms of dyspnea: Secondary | ICD-10-CM

## 2022-03-06 ENCOUNTER — Other Ambulatory Visit: Payer: Self-pay | Admitting: Internal Medicine

## 2022-03-06 DIAGNOSIS — J479 Bronchiectasis, uncomplicated: Secondary | ICD-10-CM | POA: Insufficient documentation

## 2022-03-06 DIAGNOSIS — R0609 Other forms of dyspnea: Secondary | ICD-10-CM

## 2022-03-09 ENCOUNTER — Encounter: Payer: Self-pay | Admitting: Internal Medicine

## 2022-03-09 ENCOUNTER — Ambulatory Visit: Payer: 59 | Admitting: Internal Medicine

## 2022-03-09 ENCOUNTER — Other Ambulatory Visit (HOSPITAL_COMMUNITY): Payer: Self-pay

## 2022-03-09 ENCOUNTER — Other Ambulatory Visit
Admission: RE | Admit: 2022-03-09 | Discharge: 2022-03-09 | Disposition: A | Discharge status: Home / Self Care | Payer: 59 | Source: Ambulatory Visit | Attending: Internal Medicine | Admitting: Internal Medicine

## 2022-03-09 ENCOUNTER — Other Ambulatory Visit: Payer: Self-pay | Admitting: Internal Medicine

## 2022-03-09 DIAGNOSIS — J479 Bronchiectasis, uncomplicated: Secondary | ICD-10-CM | POA: Diagnosis not present

## 2022-03-09 LAB — CBC WITH DIFFERENTIAL/PLATELET
Abs Immature Granulocytes: 0.01 10*3/uL (ref 0.00–0.07)
Basophils Absolute: 0 10*3/uL (ref 0.0–0.1)
Basophils Relative: 1 %
Eosinophils Absolute: 0.1 10*3/uL (ref 0.0–0.5)
Eosinophils Relative: 1 %
HCT: 40.4 % (ref 36.0–46.0)
Hemoglobin: 13.1 g/dL (ref 12.0–15.0)
Immature Granulocytes: 0 %
Lymphocytes Relative: 26 %
Lymphs Abs: 1.5 10*3/uL (ref 0.7–4.0)
MCH: 31.6 pg (ref 26.0–34.0)
MCHC: 32.4 g/dL (ref 30.0–36.0)
MCV: 97.3 fL (ref 80.0–100.0)
Monocytes Absolute: 0.5 10*3/uL (ref 0.1–1.0)
Monocytes Relative: 8 %
Neutro Abs: 3.5 10*3/uL (ref 1.7–7.7)
Neutrophils Relative %: 64 %
Platelets: 217 10*3/uL (ref 150–400)
RBC: 4.15 MIL/uL (ref 3.87–5.11)
RDW: 13.2 % (ref 11.5–15.5)
WBC: 5.6 10*3/uL (ref 4.0–10.5)
nRBC: 0 % (ref 0.0–0.2)

## 2022-03-09 LAB — SEDIMENTATION RATE: Sed Rate: 5 mm/hr (ref 0–30)

## 2022-03-09 MED ORDER — VALACYCLOVIR HCL 1 G PO TABS
2000.0000 mg | ORAL_TABLET | Freq: Two times a day (BID) | ORAL | 0 refills | Status: DC
  Filled 2022-03-09: qty 8, 2d supply, fill #0

## 2022-03-09 MED ORDER — FAMOTIDINE 20 MG PO TABS
20.0000 mg | ORAL_TABLET | Freq: Every day | ORAL | 11 refills | Status: DC
  Filled 2022-03-09: qty 30, 30d supply, fill #0
  Filled 2022-04-09: qty 30, 30d supply, fill #1
  Filled 2022-05-11: qty 30, 30d supply, fill #2
  Filled 2022-07-20: qty 30, 30d supply, fill #3
  Filled 2022-10-03: qty 30, 30d supply, fill #4
  Filled 2022-10-30: qty 30, 30d supply, fill #5
  Filled 2023-02-07: qty 30, 30d supply, fill #6

## 2022-03-09 MED ORDER — PANTOPRAZOLE SODIUM 40 MG PO TBEC
40.0000 mg | DELAYED_RELEASE_TABLET | Freq: Every day | ORAL | 2 refills | Status: DC
  Filled 2022-03-09: qty 30, 30d supply, fill #0
  Filled 2022-04-09: qty 30, 30d supply, fill #1
  Filled 2022-05-11: qty 30, 30d supply, fill #2

## 2022-03-09 NOTE — Patient Instructions (Addendum)
Pantoprazole (protonix) 40 mg  Take  30-60 min before first meal of the day and Pepcid (famotidine)  20 mg after supper until return to office - this is the best way to tell whether stomach acid is contributing to your problem.    GERD (REFLUX)  is an extremely common cause of respiratory symptoms just like yours , many times with no obvious heartburn at all.    It can be treated with medication, but also with lifestyle changes including elevation of the head of your bed (ideally with 6 -8inch blocks under the headboard of your bed),  Smoking cessation, avoidance of late meals, excessive alcohol, and avoid fatty foods, chocolate, peppermint, colas, red wine, and acidic juices such as orange juice.  NO MINT OR MENTHOL PRODUCTS SO NO COUGH DROPS  USE SUGARLESS CANDY INSTEAD (Jolley ranchers or Stover's or Life Savers) or even ice chips will also do - the key is to swallow to prevent all throat clearing. NO OIL BASED VITAMINS - use powdered substitutes.  Avoid fish oil when coughing.   Please remember to go to the lab department   for your tests - we will call you with the results when they are available.      Please schedule a follow up office visit in 6 weeks, call sooner if needed with cxr same day.

## 2022-03-09 NOTE — Progress Notes (Signed)
Received flu vaccine 2 weeks ago at work.

## 2022-03-09 NOTE — Progress Notes (Signed)
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   Bad pna 2006  > [facility]  chest2/2007 and 06/2006 did not show bronchiectasis   Winter 6/23 bad uri > UC > rx albuterol rec not used      History of Present Illness  03/09/2022  Pulmonary/ 1st office eval/ Christine Marquez / [employer] Complaint  Patient presents with   Consult  Dyspnea:  only with ex x sev months Cough: occ cough to point of vomiting sev times a month  Sleep: on side flat 2 pillows  SABA use: not using now/ doesn't feel it helped any of her symptoms Cp midline shooting to back, no nausea, never supine and more likely  sitting > walking   No obvious other day to day or daytime pattern/variability or assoc excess/ purulent sputum or mucus plugs or hemoptysis or   subjective wheeze or overt sinus  symptoms.   Sleeping  without nocturnal  or early am exacerbation  of respiratory  c/o's or need for noct saba. Also denies any obvious fluctuation of symptoms with weather or environmental changes or other aggravating or alleviating factors except as outlined above   No unusual exposure hx or h/o childhood pna/ asthma or knowledge of premature birth.  Current Allergies, Complete Past Medical History, Past Surgical History, Family History, and Social History were reviewed in [employer] record.  ROS  The following are not active complaints unless bolded Hoarseness, sore throat, dysphagia, dental problems, itching, sneezing,  nasal congestion or discharge of excess mucus or purulent secretions, ear ache,   fever, chills, sweats, unintended wt loss or wt gain, classically pleuritic or exertional cp,   orthopnea pnd or arm/hand swelling  or leg swelling, presyncope, palpitations, abdominal pain, anorexia, nausea, vomiting, diarrhea  or change in bowel habits or change in bladder habits, change in stools or change in urine, dysuria, hematuria,  rash, arthralgias, visual complaints, headache, numbness, weakness or ataxia or problems with walking or coordination,  change in mood or  memory.           No past medical history on file.  Outpatient Medications Prior to Visit  Medication Sig Dispense Refill   acetaminophen (TYLENOL) 325 MG tablet Take 650 mg by mouth every 6 (six) hours as needed for moderate pain.     albuterol (VENTOLIN HFA) 108 (90 Base) MCG/ACT inhaler Inhale 1-2 puffs into the lungs every 6 (six) hours as needed for wheezing or shortness of breath. (Patient not taking: Reported on 03/09/2022) 18 g 0   benzonatate (TESSALON) 100 MG capsule Take 1 capsule (100 mg total) by mouth 2 (two) times daily as needed for cough. (Patient not taking: Reported on 03/09/2022) 20 capsule 0   doxycycline (VIBRAMYCIN) 50 MG capsule Take 1 capsule by mouth once daily with food and water as directed. **Use sun protection. (Patient not taking: Reported on 03/09/2022) 30 capsule 1   hydrOXYzine (VISTARIL) 25 MG capsule Take 1 capsule (25 mg total) by mouth at bedtime as needed for anxiety (Insmonia). (Patient not taking: Reported on 03/09/2022)  30 capsule 4   ibuprofen (ADVIL) 600 MG tablet Take 1 tablet (600 mg total) by mouth every 6 (six) hours as needed. (Patient not taking: Reported on 03/09/2022) 30 tablet 0   valACYclovir (VALTREX) 1000 MG tablet TAKE 2 TABLETS BY MOUTH 2 TIMES DAILY FOR 1 DAY (Patient not taking: Reported on 03/09/2022) 8 tablet 0   cyclobenzaprine (FLEXERIL) 5 MG tablet Take 1 tablet (5 mg total) by mouth 3 (three) times daily as needed for muscle spasms. (Patient not taking: Reported on 03/09/2022) 30 tablet 1   oxybutynin (DITROPAN XL) 10 MG 24 hr tablet Take 1 tablet (10 mg  total) by mouth at bedtime. (Patient not taking: Reported on 03/09/2022) 30 tablet 5   No facility-administered medications prior to visit.     Objective:     BP 108/70 (BP Location: Left Arm, Patient Position: Sitting, Cuff Size: Normal)   Pulse 86   Temp 98 F (36.7 C) (Oral)   Ht 5' 6.5" (1.689 m)   Wt 144 lb 9.6 oz (65.6 kg)   SpO2 99%   BMI 22.99 kg/m   SpO2: 99 % RA  Amb pleasant wf nad    HEENT : Oropharynx  clear      Nasal turbinates nl    NECK :  without  apparent JVD/ palpable Nodes/TM    LUNGS: no acc muscle use,  Nl contour chest which is clear to A and P bilaterally without cough on insp or exp maneuvers   CV:  RRR  no s3 or murmur or increase in P2, and no edema   ABD:  soft and nontender with nl inspiratory excursion in the supine position. No bruits or organomegaly appreciated   MS:  Nl gait/ ext warm without deformities Or obvious joint restrictions  calf tenderness, cyanosis  -- ? Very subtle clubbing    SKIN: warm and dry without lesions    NEURO:  alert, approp, nl sensorium with  no motor or cerebellar deficits apparent.     I personally reviewed images and agree with radiology impression as follows:   Chest CT w/o contrast   03/03/22 1. Cylindrical bronchiectasis, peribronchovascular nodularity and scattered mucoid impaction in the upper lobes and right middle lobe, most compatible with mycobacterium avium complex. 2.  Aortic atherosclerosis (ICD10-I70.0).    Labs  reviewed:      Chemistry      Component Value Date/Time   NA 142 03/09/2021 0956   K 4.7 03/09/2021 0956   CL 102 03/09/2021 0956   CO2 26 03/09/2021 0956   BUN 13 03/09/2021 0956   CREATININE 0.68 03/09/2021 0956   CREATININE 0.73 11/19/2019 1151      Component Value Date/Time   CALCIUM 9.7 03/09/2021 0956   ALKPHOS 130 (H) 03/09/2021 0956   AST 26 03/09/2021 0956   ALT 19 03/09/2021 0956   BILITOT 0.4 03/09/2021 0956        Lab Results  Component Value  Date   WBC 5.6 03/09/2022   HGB 13.1 03/09/2022   HCT 40.4 03/09/2022   MCV 97.3 03/09/2022   PLT 217 03/09/2022       EOS  0.0                                   03/09/2022       Lab Results  Component Value Date   TSH 0.834 03/09/2021          Lab Results  Component Value Date   ESRSEDRATE 5 03/09/2022           Assessment   Bronchiectasis without complication (HCC) Onset of symptoms summer of 2023 with "URI"  -  Chest CT w/o contrast   03/03/22 1. Cylindrical bronchiectasis, peribronchovascular nodularity and scattered mucoid impaction in the upper lobes and right middle lobe - Quant Ig's 03/09/2022 wnl  - Quant TB 03/09/2022  8 -Labs ordered 03/09/2022  :  allergy screen Eos 0.0/  alpha one AT phenotype pending   This is an extremely common benign condition in the elderly and does not warrant aggressive eval/ rx at this point unless there is a clinical correlation suggesting unaddressed pulmonary infection (purulent sputum, night sweats, unintended wt loss, doe) or evolution of  obvious changes on plain cxr (as opposed to serial CT, which is way over sensitive to make clinical decisions re intervention and treatment in the elderly, who tend to tolerate both dx and treatment poorly) .   In meantime her midline and cough point of vomiting are classic for LPR/ GERD and need to be aggressively treated with diet/ ppi and close f/u with cxr planned at next ov  And collection of sputum in meantime if mucus turns purulent ( for atypical tb/ opportunistic infections like pseudomonas).   Discussed in detail all the  indications, usual  risks and alternatives  relative to the benefits with patient who agrees to proceed with w/u as outlined.            Each maintenance medication was reviewed in detail including emphasizing most importantly the difference between maintenance and prns and under what circumstances the prns  are to be triggered using an action plan format where appropriate.  Total time for H and P, chart review, counseling,  and generating customized AVS unique to this office visit / same day charting  > 45 min with pt new to me          Christinia Gully, MD 03/09/2022

## 2022-03-10 ENCOUNTER — Other Ambulatory Visit (HOSPITAL_COMMUNITY): Payer: Self-pay

## 2022-03-10 ENCOUNTER — Other Ambulatory Visit: Payer: Self-pay | Admitting: Internal Medicine

## 2022-03-10 DIAGNOSIS — F5101 Primary insomnia: Secondary | ICD-10-CM

## 2022-03-10 LAB — IGG, IGA, IGM
IgA: 175 mg/dL (ref 87–352)
IgG (Immunoglobin G), Serum: 826 mg/dL (ref 586–1602)
IgM (Immunoglobulin M), Srm: 64 mg/dL (ref 26–217)

## 2022-03-10 MED ORDER — HYDROXYZINE PAMOATE 25 MG PO CAPS
25.0000 mg | ORAL_CAPSULE | Freq: Every evening | ORAL | 4 refills | Status: DC | PRN
  Filled 2022-03-10: qty 30, 30d supply, fill #0
  Filled 2022-07-20: qty 30, 30d supply, fill #1
  Filled 2022-10-03: qty 30, 30d supply, fill #2
  Filled 2022-10-30: qty 30, 30d supply, fill #3
  Filled 2022-12-08: qty 30, 30d supply, fill #4

## 2022-03-12 LAB — IGE: IgE (Immunoglobulin E), Serum: 8 IU/mL (ref 6–495)

## 2022-03-13 ENCOUNTER — Telehealth: Payer: Self-pay | Admitting: Internal Medicine

## 2022-03-13 ENCOUNTER — Encounter: Payer: Self-pay | Admitting: Internal Medicine

## 2022-03-13 DIAGNOSIS — J479 Bronchiectasis, uncomplicated: Secondary | ICD-10-CM

## 2022-03-13 DIAGNOSIS — R0609 Other forms of dyspnea: Secondary | ICD-10-CM

## 2022-03-13 LAB — QUANTIFERON-TB GOLD PLUS (RQFGPL)
QuantiFERON Mitogen Value: >10 IU/mL
QuantiFERON Nil Value: 0.06 IU/mL
QuantiFERON TB1 Ag Value: 0.06 IU/mL
QuantiFERON TB2 Ag Value: 0.07 IU/mL

## 2022-03-13 LAB — QUANTIFERON-TB GOLD PLUS: QuantiFERON-TB Gold Plus: NEGATIVE

## 2022-03-13 NOTE — Assessment & Plan Note (Signed)
Onset of symptoms summer of 2023 with "URI"  -  Chest CT w/o contrast   03/03/22 1. Cylindrical bronchiectasis, peribronchovascular nodularity and scattered mucoid impaction in the upper lobes and right middle lobe - Quant Ig's 03/09/2022 wnl  - Quant TB 03/09/2022  8 -Labs ordered 03/09/2022  :  allergy screen Eos 0.0/  alpha one AT phenotype pending   This is an extremely common benign condition in the elderly and does not warrant aggressive eval/ rx at this point unless there is a clinical correlation suggesting unaddressed pulmonary infection (purulent sputum, night sweats, unintended wt loss, doe) or evolution of  obvious changes on plain cxr (as opposed to serial CT, which is way over sensitive to make clinical decisions re intervention and treatment in the elderly, who tend to tolerate both dx and treatment poorly) .   In meantime her midline and cough point of vomiting are classic for LPR/ GERD and need to be aggressively treated with diet/ ppi and close f/u with cxr planned at next ov  And collection of sputum in meantime if mucus turns purulent ( for atypical tb/ opportunistic infections like pseudomonas).   Discussed in detail all the  indications, usual  risks and alternatives  relative to the benefits with patient who agrees to proceed with w/u as outlined.            Each maintenance medication was reviewed in detail including emphasizing most importantly the difference between maintenance and prns and under what circumstances the prns are to be triggered using an action plan format where appropriate.  Total time for H and P, chart review, counseling,  and generating customized AVS unique to this office visit / same day charting  > 45 min with pt new to me

## 2022-03-13 NOTE — Telephone Encounter (Signed)
Called and notified patient that I would place CXR order  for AP. Nothing further needed

## 2022-03-14 ENCOUNTER — Other Ambulatory Visit (HOSPITAL_COMMUNITY): Payer: Self-pay

## 2022-03-14 MED ORDER — METRONIDAZOLE 0.75 % EX CREA
1.0000 | TOPICAL_CREAM | Freq: Two times a day (BID) | CUTANEOUS | 3 refills | Status: DC
  Filled 2022-03-14: qty 45, 30d supply, fill #0
  Filled 2022-05-11: qty 45, 30d supply, fill #1
  Filled 2022-07-20: qty 45, 30d supply, fill #2
  Filled 2022-10-03: qty 45, 30d supply, fill #3

## 2022-03-15 ENCOUNTER — Other Ambulatory Visit (HOSPITAL_COMMUNITY): Payer: Self-pay

## 2022-03-15 ENCOUNTER — Encounter: Payer: Self-pay | Admitting: Internal Medicine

## 2022-03-15 ENCOUNTER — Encounter: Payer: Self-pay | Admitting: *Deleted

## 2022-03-15 ENCOUNTER — Ambulatory Visit: Payer: 59 | Attending: Internal Medicine | Admitting: Internal Medicine

## 2022-03-15 VITALS — BP 110/72 | HR 75 | Ht 66.5 in | Wt 145.0 lb

## 2022-03-15 DIAGNOSIS — I517 Cardiomegaly: Secondary | ICD-10-CM | POA: Diagnosis not present

## 2022-03-15 DIAGNOSIS — R079 Chest pain, unspecified: Secondary | ICD-10-CM | POA: Diagnosis not present

## 2022-03-15 DIAGNOSIS — R072 Precordial pain: Secondary | ICD-10-CM

## 2022-03-15 MED ORDER — NITROGLYCERIN 0.4 MG SL SUBL
0.4000 mg | SUBLINGUAL_TABLET | SUBLINGUAL | 3 refills | Status: AC | PRN
  Filled 2022-03-15: qty 25, 7d supply, fill #0

## 2022-03-15 MED ORDER — ISOSORBIDE MONONITRATE ER 30 MG PO TB24
30.0000 mg | ORAL_TABLET | Freq: Every day | ORAL | 3 refills | Status: DC
  Filled 2022-03-15: qty 90, 90d supply, fill #0

## 2022-03-15 NOTE — Patient Instructions (Signed)
Medication Instructions:   Start Imdur 30 mg daily  Start Nitro 0.4 mg PRN   *If you need a refill on your cardiac medications before your next appointment, please call your pharmacy*   Lab Work: NONE   If you have labs (blood work) drawn today and your tests are completely normal, you will receive your results only by: [place] (if you have MyChart) OR A paper copy in the mail If you have any lab test that is abnormal or we need to change your treatment, we will call you to review the results.   Testing/Procedures: Your physician has requested that you have an echocardiogram. Echocardiography is a painless test that uses sound waves to create images of your heart. It provides your doctor with information about the size and shape of your heart and how well your heart's chambers and valves are working. This procedure takes approximately one hour. There are no restrictions for this procedure. Please do NOT wear cologne, perfume, aftershave, or lotions (deodorant is allowed). Please arrive 15 minutes prior to your appointment time.  Your physician has requested that you have en exercise stress myoview. For further information please visit [email or web address]. Please follow instruction sheet, as given.    Follow-Up: At [facility], you and your health needs are our priority.  As part of our continuing mission to provide you with exceptional heart care, we have created designated Provider Care Teams.  These Care Teams include your primary Cardiologist (physician) and Advanced Practice Providers (APPs -  Physician Assistants and Nurse Practitioners) who all work together to provide you with the care you need, when you need it.  We recommend signing up for the patient portal called "MyChart".  Sign up information is provided on this After Visit Summary.  MyChart is used to connect with patients for Virtual Visits (Telemedicine).  Patients are able to view lab/test results,  encounter notes, upcoming appointments, etc.  Non-urgent messages can be sent to your provider as well.   To learn more about what you can do with MyChart, go to [email or web address].    Your next appointment:   1 month(s)  The format for your next appointment:   In Person  Provider:   Dr. Dellia Cloud      Other Instructions Thank you for choosing [facility]!    Important Information About Sugar

## 2022-03-15 NOTE — Progress Notes (Signed)
Cardiology Office Note  Date: 03/15/2022   ID: Christine Marquez, DOB [birth year], MRN [id number]  PCP:  Lindell Spar, MD  Cardiologist:  Chalmers Guest, MD Electrophysiologist:  None   Reason for Office Visit: Evaluation of chest pain at the request of Dr. Posey Pronto   History of Present Illness: Christine Marquez is a 61 y.o. female with no significant PMH presented to the cardiology clinic for evaluation chest pain request Dr. Posey Pronto.  Patient stated that she started to notice left-sided chest discomfort since 2 months, lasting for 2 to 3 minutes, 3 to 4/10 in intensity, frequency is 2-3 times per week, radiating to the left shoulder, occurs with exertion (mainly with climbing stairs) and sometimes at rest and relieved with rest/spontaneously.  She also stated she sometimes has midline chest pain in addition to the above quality of chest discomfort.  Denied any DOE, dizziness/lightheadedness, syncope, LE swelling, palpitations, claudication.  Denied smoking cigarettes however she was a former smoker (quit 10 years ago), chewing tobacco denies secondhand exposure to cigarette smoke, denied illicit drug abuse or alcohol use.  She has a family history of heart issues in her father side however she does not have details about the accurate medical history on her father side as she was not in contact with father anymore.  History reviewed. No pertinent past medical history.  Past Surgical History:  Procedure Laterality Date   ABDOMINAL HYSTERECTOMY     HEMORRHOID SURGERY      Current Outpatient Medications  Medication Sig Dispense Refill   acetaminophen (TYLENOL) 325 MG tablet Take 650 mg by mouth every 6 (six) hours as needed for moderate pain.     famotidine (PEPCID) 20 MG tablet Take 1 tablet (20 mg total) by mouth daily after supper 30 tablet 11   hydrOXYzine (VISTARIL) 25 MG capsule Take 1 capsule (25 mg total) by mouth at bedtime as needed for anxiety (Insmonia). 30 capsule 4    ibuprofen (ADVIL) 600 MG tablet Take 1 tablet (600 mg total) by mouth every 6 (six) hours as needed. 30 tablet 0   metroNIDAZOLE (METROCREAM) 0.75 % cream Apply topically 2 times daily. Apply a dime-size amount to the entire face. 45 g 3   pantoprazole (PROTONIX) 40 MG tablet Take 1 tablet (40 mg total) by mouth daily. Take 30 - 60 minutes before first meal of the day 30 tablet 2   valACYclovir (VALTREX) 1000 MG tablet Take 2 tablets (2,000 mg total) by mouth 2 (two) times daily for 1 day 8 tablet 0   No current facility-administered medications for this visit.   Allergies:  Penicillins   Social History: The patient  reports that she quit smoking about 8 years ago. Her smoking use included cigarettes. She has never used smokeless tobacco. She reports that she does not drink alcohol and does not use drugs.   Family History: The patient's family history includes Cancer in her maternal grandfather and maternal grandmother; Diabetes in an other family member; Heart failure in her father.   ROS:  Please see the history of present illness. Otherwise, complete review of systems is positive for none.  All other systems are reviewed and negative.   Physical Exam: VS:  BP 110/72   Pulse 75   Ht 5' 6.5" (1.689 m)   Wt 145 lb (65.8 kg)   SpO2 98%   BMI 23.05 kg/m , BMI Body mass index is 23.05 kg/m.  Wt Readings from Last 3 Encounters:  03/15/22 145  lb (65.8 kg)  03/09/22 144 lb 9.6 oz (65.6 kg)  02/22/22 144 lb 9.6 oz (65.6 kg)    General: Patient appears comfortable at rest. HEENT: Conjunctiva and lids normal, oropharynx clear with moist mucosa. Neck: Supple, no elevated JVP or carotid bruits, no thyromegaly. Lungs: Clear to auscultation, nonlabored breathing at rest. Cardiac: Regular rate and rhythm, no S3 or significant systolic murmur, no pericardial rub. Abdomen: Soft, nontender, no hepatomegaly, bowel sounds present, no guarding or rebound. Extremities: No pitting edema, distal pulses  2+. Skin: Warm and dry. Musculoskeletal: No kyphosis. Neuropsychiatric: Alert and oriented x3, affect grossly appropriate.  ECG:  An ECG dated 02/23 was personally reviewed today and demonstrated:  Normal sinus rhythm and right atrial enlargement  Recent Labwork: 03/09/2022: Hemoglobin 13.1; Platelets 217     Component Value Date/Time   CHOL 188 03/09/2021 0956   TRIG 57 03/09/2021 0956   HDL 92 03/09/2021 0956   CHOLHDL 2.0 03/09/2021 0956   CHOLHDL 2.2 11/19/2019 1151   VLDL 7 11/07/2017 0900   LDLCALC 85 03/09/2021 0956   LDLCALC 98 11/19/2019 1151    Other Studies Reviewed Today: None  Assessment and Plan: Patient is a 61 year old F with no significant PMH was referred to the cardiology clinic for evaluation of chest pain at the request of Dr. Posey Pronto.  #Possibly cardiac chest pain Plan -Start Imdur 30 mg once daily. Patient is educated on the side effects of dizziness, hypotension, headaches.  SL NTG 0.4 mg as needed for breakthrough chest pains.  ER precautions for chest pain provided -Obtain treadmill Myoview test -Obtain 2D Echo  #Right atrial enlargement on EKG Plan -Obtain 2D echo  I have spent a total of 45 minutes with patient reviewing chart,, EKGs, labs and examining patient as well as establishing an assessment and plan that was discussed with the patient.  > 50% of time was spent in direct patient care.     Medication Adjustments/Labs and Tests Ordered: Current medicines are reviewed at length with the patient today.  Concerns regarding medicines are outlined above.   Tests Ordered: Orders Placed This Encounter  Procedures   EKG 12-Lead    Medication Changes: No orders of the defined types were placed in this encounter.   Disposition:  Follow up  1 month  Signed, Rori Goar Fidel Levy, MD, 03/15/2022 8:32 AM    [facility] at [facility] [address] [place], [place] [address]

## 2022-03-16 ENCOUNTER — Other Ambulatory Visit (HOSPITAL_COMMUNITY): Payer: Self-pay

## 2022-03-20 LAB — ALPHA-1-ANTITRYPSIN PHENOTYP: A-1 Antitrypsin, Ser: 150 mg/dL (ref 101–187)

## 2022-03-21 ENCOUNTER — Telehealth: Payer: Self-pay | Admitting: Internal Medicine

## 2022-03-21 NOTE — Telephone Encounter (Signed)
  Pt said, she needs to pay $1200 upfront for her nuclear stress test. She would like to ask Dr. Dellia Cloud if she can do the echo first and wait for the result if she does need to get the stress test

## 2022-03-21 NOTE — Telephone Encounter (Signed)
Patient notified and verbalized understanding. 

## 2022-03-23 ENCOUNTER — Ambulatory Visit (HOSPITAL_COMMUNITY): Payer: 59

## 2022-03-23 ENCOUNTER — Ambulatory Visit (HOSPITAL_COMMUNITY)
Admission: RE | Admit: 2022-03-23 | Discharge: 2022-03-23 | Disposition: A | Discharge status: Home / Self Care | Payer: 59 | Source: Ambulatory Visit | Attending: Internal Medicine | Admitting: Internal Medicine

## 2022-03-23 ENCOUNTER — Encounter (HOSPITAL_COMMUNITY): Payer: 59

## 2022-03-23 DIAGNOSIS — R072 Precordial pain: Secondary | ICD-10-CM

## 2022-03-23 DIAGNOSIS — Z1231 Encounter for screening mammogram for malignant neoplasm of breast: Secondary | ICD-10-CM | POA: Diagnosis not present

## 2022-03-23 LAB — ECHOCARDIOGRAM COMPLETE
Area-P 1/2: 3.48 cm2
S' Lateral: 2.5 cm

## 2022-04-05 ENCOUNTER — Ambulatory Visit (HOSPITAL_COMMUNITY): Payer: 59

## 2022-04-10 ENCOUNTER — Other Ambulatory Visit (HOSPITAL_COMMUNITY): Payer: Self-pay

## 2022-04-21 ENCOUNTER — Ambulatory Visit: Payer: 59 | Attending: Internal Medicine | Admitting: Internal Medicine

## 2022-04-21 ENCOUNTER — Encounter: Payer: Self-pay | Admitting: Internal Medicine

## 2022-04-21 ENCOUNTER — Ambulatory Visit: Payer: 59 | Admitting: Internal Medicine

## 2022-04-21 ENCOUNTER — Ambulatory Visit (HOSPITAL_COMMUNITY)
Admission: RE | Admit: 2022-04-21 | Discharge: 2022-04-21 | Disposition: A | Discharge status: Home / Self Care | Payer: 59 | Source: Ambulatory Visit | Attending: Internal Medicine | Admitting: Internal Medicine

## 2022-04-21 VITALS — BP 122/56 | HR 78 | Ht 67.0 in | Wt 143.0 lb

## 2022-04-21 DIAGNOSIS — R0602 Shortness of breath: Secondary | ICD-10-CM | POA: Diagnosis not present

## 2022-04-21 DIAGNOSIS — R0609 Other forms of dyspnea: Secondary | ICD-10-CM | POA: Diagnosis not present

## 2022-04-21 DIAGNOSIS — J479 Bronchiectasis, uncomplicated: Secondary | ICD-10-CM | POA: Insufficient documentation

## 2022-04-21 DIAGNOSIS — I351 Nonrheumatic aortic (valve) insufficiency: Secondary | ICD-10-CM | POA: Diagnosis not present

## 2022-04-21 NOTE — Progress Notes (Unsigned)
Cardiology Office Note  Date: 04/21/2022   ID: [name], DOB 1960/05/30, MRN [id number]  PCP:  Lindell Spar, MD  Cardiologist:  Chalmers Guest, MD Electrophysiologist:  None   Reason for Office Visit: Evaluation of chest pain at the request of Dr. Posey Pronto   History of Present Illness: Christine Marquez is a 61 y.o. female with no significant PMH presented to the cardiology for follow-up visit of chest pain. She has been having chest pain radiating to the left shoulder for 2 months prior to the initial cardiology clinic visit for which echo and access Myoview were ordered. Echo showed normal LVEF and mild aortic regurgitation.  Exercise Myoview was not performed because she had to wait $1200 upfront despite having insurance. In addition, she started to take Protonix in the morning and Pepcid in the evening after which the chest pain completely resolved. She also underwent CT calcium scoring which was 0 in 02/2022. She denied any new symptoms since she last saw me.  History reviewed. No pertinent past medical history.  Past Surgical History:  Procedure Laterality Date   ABDOMINAL HYSTERECTOMY     HEMORRHOID SURGERY      Current Outpatient Medications  Medication Sig Dispense Refill   acetaminophen (TYLENOL) 325 MG tablet Take 650 mg by mouth every 6 (six) hours as needed for moderate pain.     famotidine (PEPCID) 20 MG tablet Take 1 tablet (20 mg total) by mouth daily after supper 30 tablet 11   hydrOXYzine (VISTARIL) 25 MG capsule Take 1 capsule (25 mg total) by mouth at bedtime as needed for anxiety (Insmonia). 30 capsule 4   ibuprofen (ADVIL) 600 MG tablet Take 1 tablet (600 mg total) by mouth every 6 (six) hours as needed. 30 tablet 0   metroNIDAZOLE (METROCREAM) 0.75 % cream Apply topically 2 times daily. Apply a dime-size amount to the entire face. 45 g 3   nitroGLYCERIN (NITROSTAT) 0.4 MG SL tablet Place 1 tablet (0.4 mg total) under the tongue every 5 (five) minutes  as needed for chest pain. If no relief after 3 doses, call 911 90 tablet 3   pantoprazole (PROTONIX) 40 MG tablet Take 1 tablet (40 mg total) by mouth daily. Take 30 - 60 minutes before first meal of the day 30 tablet 2   valACYclovir (VALTREX) 1000 MG tablet Take 2 tablets (2,000 mg total) by mouth 2 (two) times daily for 1 day 8 tablet 0   No current facility-administered medications for this visit.   Allergies:  Penicillins   Social History: The patient  reports that she quit smoking about 8 years ago. Her smoking use included cigarettes. She has never used smokeless tobacco. She reports that she does not drink alcohol and does not use drugs.   Family History: The patient's family history includes Cancer in her maternal grandfather and maternal grandmother; Diabetes in an other family member; Heart failure in her father.   ROS:  Please see the history of present illness. Otherwise, complete review of systems is positive for none.  All other systems are reviewed and negative.   Physical Exam: VS:  BP (!) 122/56   Pulse 78   Ht '5\' 7"'$  (1.702 m)   Wt 143 lb (64.9 kg)   SpO2 96%   BMI 22.40 kg/m , BMI Body mass index is 22.4 kg/m.  Wt Readings from Last 3 Encounters:  04/21/22 143 lb (64.9 kg)  03/15/22 145 lb (65.8 kg)  03/09/22 144 lb 9.6  oz (65.6 kg)    General: Patient appears comfortable at rest. HEENT: Conjunctiva and lids normal, oropharynx clear with moist mucosa. Neck: Supple, no elevated JVP or carotid bruits, no thyromegaly. Lungs: Clear to auscultation, nonlabored breathing at rest. Cardiac: Regular rate and rhythm, no S3 or significant systolic murmur, no pericardial rub. Abdomen: Soft, nontender, no hepatomegaly, bowel sounds present, no guarding or rebound. Extremities: No pitting edema, distal pulses 2+. Skin: Warm and dry. Musculoskeletal: No kyphosis. Neuropsychiatric: Alert and oriented x3, affect grossly appropriate.  ECG:  An ECG dated 02/23 was personally  reviewed today and demonstrated:  Normal sinus rhythm and right atrial enlargement  Recent Labwork: 03/09/2022: Hemoglobin 13.1; Platelets 217     Component Value Date/Time   CHOL 188 03/09/2021 0956   TRIG 57 03/09/2021 0956   HDL 92 03/09/2021 0956   CHOLHDL 2.0 03/09/2021 0956   CHOLHDL 2.2 11/19/2019 1151   VLDL 7 11/07/2017 0900   LDLCALC 85 03/09/2021 0956   LDLCALC 98 11/19/2019 1151    Other Studies Reviewed Today: Echo in 03/2022 LVEF preserved Mild aortic regurgitation  Assessment and Plan: Patient is a 61 year old F with no significant PMH was referred to the cardiology clinic for evaluation of chest pain at the request of Dr. Posey Pronto.  #Non-cardiac chest pain Plan -Chest pain completely resolved after starting Protonix in AM and Pepcid in PM. No recurrence of chest pains after starting GI cocktail. CT calcium scoring is 0 and Echo showed normal LVEF and mild aortic regurgitation.  #Mild aortic regurgitation -Echo every 3 years. Next Echo in 2026.   I have spent a total of 30 minutes with patient reviewing chart,, EKGs, labs and examining patient as well as establishing an assessment and plan that was discussed with the patient.  > 50% of time was spent in direct patient care.     Medication Adjustments/Labs and Tests Ordered: Current medicines are reviewed at length with the patient today.  Concerns regarding medicines are outlined above.   Tests Ordered: No orders of the defined types were placed in this encounter.   Medication Changes: No orders of the defined types were placed in this encounter.   Disposition:  Follow up PRN  Signed, Calyn Sivils Fidel Levy, MD, 04/21/2022 1:02 PM    [facility] at [address] [place], [place] [address]

## 2022-04-21 NOTE — Patient Instructions (Signed)
Medication Instructions:  Your physician has recommended you make the following change in your medication:  -Stop Imdur   Labwork: None  Testing/Procedures: None  Follow-Up: Follow up with Dr. Dellia Cloud as needed.   Any Other Special Instructions Will Be Listed Below (If Applicable).     If you need a refill on your cardiac medications before your next appointment, please call your pharmacy.

## 2022-04-22 DIAGNOSIS — I351 Nonrheumatic aortic (valve) insufficiency: Secondary | ICD-10-CM | POA: Insufficient documentation

## 2022-04-24 ENCOUNTER — Other Ambulatory Visit: Payer: Self-pay | Admitting: Internal Medicine

## 2022-04-24 ENCOUNTER — Other Ambulatory Visit (HOSPITAL_COMMUNITY): Payer: Self-pay

## 2022-04-24 MED ORDER — VALACYCLOVIR HCL 1 G PO TABS
1000.0000 mg | ORAL_TABLET | Freq: Two times a day (BID) | ORAL | 1 refills | Status: DC
  Filled 2022-04-24: qty 60, 30d supply, fill #0

## 2022-04-25 ENCOUNTER — Ambulatory Visit: Payer: 59 | Admitting: Internal Medicine

## 2022-04-25 ENCOUNTER — Other Ambulatory Visit (HOSPITAL_COMMUNITY): Payer: Self-pay

## 2022-04-25 ENCOUNTER — Encounter: Payer: Self-pay | Admitting: Internal Medicine

## 2022-04-25 VITALS — BP 136/78 | HR 80 | Temp 97.6°F | Ht 67.0 in | Wt 143.2 lb

## 2022-04-25 DIAGNOSIS — J479 Bronchiectasis, uncomplicated: Secondary | ICD-10-CM

## 2022-04-25 MED ORDER — AZITHROMYCIN 250 MG PO TABS
ORAL_TABLET | ORAL | 11 refills | Status: DC
  Filled 2022-04-25: qty 6, 5d supply, fill #0
  Filled 2022-07-20: qty 6, 5d supply, fill #1
  Filled 2022-12-15: qty 6, 5d supply, fill #2
  Filled 2023-02-07: qty 6, 5d supply, fill #3

## 2022-04-25 NOTE — Assessment & Plan Note (Addendum)
Onset of symptoms summer of 2023 with "URI"  -  Chest CT w/o contrast   03/03/22 1. Cylindrical bronchiectasis, peribronchovascular nodularity and scattered mucoid impaction in the upper lobes and right middle lobe - Quant Ig's 03/09/2022 wnl  - Quant TB 03/09/2022  Neg  -Labs ordered 03/09/2022  :  allergy screen Eos 0.0/ IgE 8  alpha one AT phenotype MM  Level 150   Well compensated with baseline cxr nl and minimal cough s so far much tendency to acute exac so rec  Mucinex dm 1200 mg bid prn Zpak prn as long as effective in turning dark mucus light/clear   F/u q 12 m, sooner prn         Each maintenance medication was reviewed in detail including emphasizing most importantly the difference between maintenance and prns and under what circumstances the prns are to be triggered using an action plan format where appropriate.  Total time for H and P, chart review, counseling,  and generating customized AVS unique to this office visit / same day charting = 25 min

## 2022-04-25 NOTE — Progress Notes (Signed)
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   Bad pna 2006  > [facility]  chest 06/2005 and 06/2006 did not show bronchiectasis   Winter 6/23 bad uri > UC > rx albuterol rec not used    History of Present Illness  03/09/2022  Pulmonary/ 1st office eval/ Christine Marquez / [employer] Complaint  Patient presents with   Consult  Dyspnea:  only with ex x sev months Cough: occ cough to point of vomiting sev times a month  Sleep: on side flat 2 pillows  SABA use: not using now/ doesn't feel it helped any of her symptoms Cp midline shooting to back, no nausea, never supine and more likely  sitting > walking  Rec Pantoprazole (protonix) 40 mg  Take  30-60 min before first meal of the day and Pepcid (famotidine)  20 mg after supper until return to office  GERD diet reviewed, bed blocks rec    04/25/2022  f/u ov/[place] office/Christine Marquez re: bronchiectasis  maint on gerd rx  cxr today  Chief Complaint  Patient presents with   Follow-up    Cough has improved     Dyspnea:  chasing grandchildren s sob  Cough: slt am cough / min mucoid  Sleeping: flat / 3 pillows  SABA use: none  02: none  Covid status: 4 vax/ once infected  Lung cancer screening: not eligible    No obvious day to day or daytime variability or assoc excess/ purulent sputum or mucus plugs or hemoptysis or cp or chest tightness, subjective wheeze or overt sinus or hb symptoms.   Sleeping  without nocturnal  exacerbation  of respiratory  c/o's or need for noct saba. Also denies any obvious fluctuation of symptoms with weather or environmental changes or other aggravating or alleviating factors except as outlined above    No unusual exposure hx or h/o childhood pna/ asthma or knowledge of premature birth.  Current Allergies, Complete Past Medical History, Past Surgical History, Family History, and Social History were reviewed in [employer] record.  ROS  The following are not active complaints unless bolded Hoarseness, sore throat, dysphagia, dental problems, itching, sneezing,  nasal congestion or discharge of excess mucus or purulent secretions, ear ache,   fever, chills, sweats, unintended wt loss or wt gain, classically pleuritic or exertional cp,  orthopnea pnd or arm/hand swelling  or leg swelling, presyncope, palpitations, abdominal pain, anorexia, nausea, vomiting, diarrhea  or change in bowel habits or change in bladder habits, change in stools or change in urine, dysuria, hematuria,  rash, arthralgias, visual complaints, headache, numbness, weakness or ataxia or problems with walking or coordination,  change in mood or  memory.        Current Meds  Medication Sig   acetaminophen (TYLENOL) 325 MG tablet Take 650 mg by mouth every 6 (six) hours as needed for moderate pain.   famotidine (PEPCID) 20 MG tablet Take 1 tablet (20 mg total) by mouth daily after supper   hydrOXYzine (VISTARIL) 25 MG capsule Take 1 capsule (25 mg total) by mouth  at bedtime as needed for anxiety (Insmonia).   ibuprofen (ADVIL) 600 MG tablet Take 1 tablet (600 mg total) by mouth every 6 (six) hours as needed.   metroNIDAZOLE (METROCREAM) 0.75 % cream Apply topically 2 times daily. Apply a dime-size amount to the entire face.   nitroGLYCERIN (NITROSTAT) 0.4 MG SL tablet Place 1 tablet (0.4 mg total) under the tongue every 5 (five) minutes as needed for chest pain. If no relief after 3 doses, call 911   pantoprazole (PROTONIX) 40 MG tablet Take 1 tablet (40 mg total) by mouth daily. Take 30 - 60 minutes before first meal of the day   valACYclovir (VALTREX) 1000 MG tablet Take 1 tablet (1,000 mg total) by mouth  2 (two) times daily.              Objective:     Wt Readings from Last 3 Encounters:  04/25/22 143 lb 3.2 oz (65 kg)  04/21/22 143 lb (64.9 kg)  03/15/22 145 lb (65.8 kg)      Vital signs reviewed  04/25/2022  - Note at rest 02 sats  99% on RA   General appearance:    amb wf all smiles        HEENT : Oropharynx  clear          NECK :  without  apparent JVD/ palpable Nodes/TM    LUNGS: no acc muscle use,  Nl contour chest which is clear to A and P bilaterally without cough on insp or exp maneuvers   CV:  RRR  no s3 or murmur or increase in P2, and no edema   ABD:  soft and nontender with nl inspiratory excursion in the supine position. No bruits or organomegaly appreciated   MS:  Nl gait/ ext warm without deformities Or obvious joint restrictions  calf tenderness, cyanosis  - ? Very subtle clubbing     SKIN: warm and dry without lesions    NEURO:  alert, approp, nl sensorium with  no motor or cerebellar deficits apparent.           Assessment           Each [employer]

## 2022-04-25 NOTE — Patient Instructions (Addendum)
For cough/ congestion >  mucinex dm 1200 mg every 12 hours as needed   For nasty mucus > zpak as needed as long as it turns  the mucus clear again   Please schedule a follow up visit in 12 months but call sooner if needed

## 2022-05-12 ENCOUNTER — Other Ambulatory Visit: Payer: Self-pay

## 2022-07-20 ENCOUNTER — Encounter: Payer: Self-pay | Admitting: Radiology

## 2022-07-20 ENCOUNTER — Other Ambulatory Visit: Payer: Self-pay | Admitting: Internal Medicine

## 2022-07-20 DIAGNOSIS — J479 Bronchiectasis, uncomplicated: Secondary | ICD-10-CM

## 2022-07-21 ENCOUNTER — Other Ambulatory Visit (HOSPITAL_COMMUNITY): Payer: Self-pay

## 2022-07-21 ENCOUNTER — Other Ambulatory Visit: Payer: Self-pay

## 2022-07-21 MED ORDER — PANTOPRAZOLE SODIUM 40 MG PO TBEC
40.0000 mg | DELAYED_RELEASE_TABLET | Freq: Every day | ORAL | 2 refills | Status: DC
  Filled 2022-07-21: qty 30, 30d supply, fill #0
  Filled 2022-10-03: qty 30, 30d supply, fill #1
  Filled 2022-10-30: qty 30, 30d supply, fill #2

## 2022-07-24 ENCOUNTER — Other Ambulatory Visit (HOSPITAL_COMMUNITY): Payer: Self-pay

## 2022-09-10 ENCOUNTER — Other Ambulatory Visit: Payer: Self-pay

## 2022-09-10 ENCOUNTER — Encounter (HOSPITAL_COMMUNITY): Payer: Self-pay | Admitting: Emergency Medicine

## 2022-09-10 ENCOUNTER — Emergency Department (HOSPITAL_COMMUNITY)
Admission: EM | Admit: 2022-09-10 | Discharge: 2022-09-10 | Disposition: A | Discharge status: Home / Self Care | Payer: Commercial Managed Care - PPO | Attending: Emergency Medicine | Admitting: Emergency Medicine

## 2022-09-10 DIAGNOSIS — L237 Allergic contact dermatitis due to plants, except food: Secondary | ICD-10-CM

## 2022-09-10 DIAGNOSIS — Z20822 Contact with and (suspected) exposure to covid-19: Secondary | ICD-10-CM | POA: Diagnosis not present

## 2022-09-10 DIAGNOSIS — I10 Essential (primary) hypertension: Secondary | ICD-10-CM | POA: Diagnosis not present

## 2022-09-10 DIAGNOSIS — A084 Viral intestinal infection, unspecified: Secondary | ICD-10-CM | POA: Diagnosis not present

## 2022-09-10 DIAGNOSIS — R21 Rash and other nonspecific skin eruption: Secondary | ICD-10-CM | POA: Diagnosis present

## 2022-09-10 LAB — CBC WITH DIFFERENTIAL/PLATELET
Abs Immature Granulocytes: 0.03 10*3/uL (ref 0.00–0.07)
Basophils Absolute: 0 10*3/uL (ref 0.0–0.1)
Basophils Relative: 0 %
Eosinophils Absolute: 0 10*3/uL (ref 0.0–0.5)
Eosinophils Relative: 0 %
HCT: 43 % (ref 36.0–46.0)
Hemoglobin: 14.3 g/dL (ref 12.0–15.0)
Immature Granulocytes: 1 %
Lymphocytes Relative: 9 %
Lymphs Abs: 0.5 10*3/uL — ABNORMAL LOW (ref 0.7–4.0)
MCH: 32.1 pg (ref 26.0–34.0)
MCHC: 33.3 g/dL (ref 30.0–36.0)
MCV: 96.4 fL (ref 80.0–100.0)
Monocytes Absolute: 0.5 10*3/uL (ref 0.1–1.0)
Monocytes Relative: 9 %
Neutro Abs: 4.5 10*3/uL (ref 1.7–7.7)
Neutrophils Relative %: 81 %
Platelets: 170 10*3/uL (ref 150–400)
RBC: 4.46 MIL/uL (ref 3.87–5.11)
RDW: 13.2 % (ref 11.5–15.5)
WBC: 5.5 10*3/uL (ref 4.0–10.5)
nRBC: 0 % (ref 0.0–0.2)

## 2022-09-10 LAB — URINALYSIS, ROUTINE W REFLEX MICROSCOPIC
Bacteria, UA: NONE SEEN
Bilirubin Urine: NEGATIVE
Glucose, UA: NEGATIVE mg/dL
Hgb urine dipstick: NEGATIVE
Ketones, ur: 80 mg/dL — AB
Leukocytes,Ua: NEGATIVE
Nitrite: NEGATIVE
Protein, ur: 30 mg/dL — AB
Specific Gravity, Urine: 1.028 (ref 1.005–1.030)
pH: 5 (ref 5.0–8.0)

## 2022-09-10 LAB — COMPREHENSIVE METABOLIC PANEL
ALT: 24 U/L (ref 0–44)
AST: 27 U/L (ref 15–41)
Albumin: 4.1 g/dL (ref 3.5–5.0)
Alkaline Phosphatase: 82 U/L (ref 38–126)
Anion gap: 10 (ref 5–15)
BUN: 19 mg/dL (ref 8–23)
CO2: 25 mmol/L (ref 22–32)
Calcium: 8.8 mg/dL — ABNORMAL LOW (ref 8.9–10.3)
Chloride: 99 mmol/L (ref 98–111)
Creatinine, Ser: 0.69 mg/dL (ref 0.44–1.00)
GFR, Estimated: >60 mL/min (ref >60)
Glucose, Bld: 91 mg/dL (ref 70–99)
Potassium: 3.7 mmol/L (ref 3.5–5.1)
Sodium: 134 mmol/L — ABNORMAL LOW (ref 135–145)
Total Bilirubin: 0.6 mg/dL (ref 0.3–1.2)
Total Protein: 7.2 g/dL (ref 6.5–8.1)

## 2022-09-10 LAB — SARS CORONAVIRUS 2 BY RT PCR: SARS Coronavirus 2 by RT PCR: NEGATIVE

## 2022-09-10 LAB — TROPONIN I (HIGH SENSITIVITY)
Troponin I (High Sensitivity): <2 ng/L (ref <18)
Troponin I (High Sensitivity): <2 ng/L (ref <18)

## 2022-09-10 LAB — LIPASE, BLOOD: Lipase: 23 U/L (ref 11–51)

## 2022-09-10 MED ORDER — SODIUM CHLORIDE 0.9 % IV BOLUS: 1000.0000 mL | Freq: Once | INTRAVENOUS | Status: DC

## 2022-09-10 MED ORDER — ONDANSETRON HCL 4 MG/2ML IJ SOLN
4.0000 mg | Freq: Once | INTRAMUSCULAR | Status: AC
  Administered 2022-09-10: 4 mg via INTRAVENOUS
  Filled 2022-09-10: qty 2

## 2022-09-10 MED ORDER — PROCHLORPERAZINE EDISYLATE 10 MG/2ML IJ SOLN
5.0000 mg | Freq: Once | INTRAMUSCULAR | Status: AC
  Administered 2022-09-10: 5 mg via INTRAVENOUS
  Filled 2022-09-10: qty 2

## 2022-09-10 MED ORDER — SODIUM CHLORIDE 0.9 % IV BOLUS
500.0000 mL | Freq: Once | INTRAVENOUS | Status: AC
  Administered 2022-09-10: 500 mL via INTRAVENOUS

## 2022-09-10 MED ORDER — TRIAMCINOLONE ACETONIDE 0.1 % EX CREA
1.0000 | TOPICAL_CREAM | Freq: Two times a day (BID) | CUTANEOUS | 0 refills | Status: DC
  Filled 2022-09-10: qty 30, 15d supply, fill #0

## 2022-09-10 MED ORDER — TRIAMCINOLONE ACETONIDE 0.1 % EX CREA: 1.0000 | TOPICAL_CREAM | Freq: Two times a day (BID) | CUTANEOUS | 0 refills | Status: DC

## 2022-09-10 MED ORDER — PROCHLORPERAZINE MALEATE 10 MG PO TABS
10.0000 mg | ORAL_TABLET | Freq: Two times a day (BID) | ORAL | 0 refills | Status: DC | PRN
  Filled 2022-09-10: qty 10, 5d supply, fill #0

## 2022-09-10 MED ORDER — PROCHLORPERAZINE MALEATE 10 MG PO TABS: 10.0000 mg | ORAL_TABLET | Freq: Two times a day (BID) | ORAL | 0 refills | Status: DC | PRN

## 2022-09-10 NOTE — ED Triage Notes (Signed)
Pt c/o rash around abd area, diarrhea and nausea since Fri. Low grade fever noted at home. Pt ook 2 imodium at 0300.

## 2022-09-10 NOTE — ED Provider Notes (Signed)
[place] EMERGENCY DEPARTMENT AT [facility] Provider Note   CSN: [id number] Arrival date & time: 09/10/22  0848     History  Chief Complaint  Patient presents with   Rash   Diarrhea   Nausea    Christine Marquez is a 62 y.o. female history of HSV, mild aortic valve regurg, hypertension, DOE presented with 3 days of nausea diarrhea and rash.  Patient states she has recently been out in the garden and believes there to be poison ivy.  Rash is located on bilateral flanks and is itchy.  Patient states that there is clear fluid in the rash but then none of it has drained.  Patient has also endorsed nausea and diarrhea for the past 3 days in which she has had limited fluid and food intake due to the nausea but denies any emesis.  Patient states she is taking old Zofran at home but still endorses nausea.  Patient denied any hematochezia with her diarrhea and is going multiple times throughout the day and that she had to get up 4 times last night for bowel movements.  Patient notes that she had a temperature of 100.4 F 3 days ago that resolved with Tylenol.  Patient states her urine has had a strong odor and has been darker than usual and that since symptoms have began patient has become fatigued.  Patient denied chest pain, shortness of breath, change in sensation is motor skills, painful rash, new medications, new soaps/lotions, allergies, sick contacts, cough, dysuria  Last echo 03/23/2022: LVEF 55 to 60%  Home Medications Prior to Admission medications   Medication Sig Start Date End Date Taking? Authorizing Provider  acetaminophen (TYLENOL) 325 MG tablet Take 650 mg by mouth every 6 (six) hours as needed for moderate pain.    [provider]  azithromycin (ZITHROMAX) 250 MG tablet Take 2 tablets by mouth on day one then take 1 tablet daily for 4 days 04/25/22   Nyoka Cowden, MD  famotidine (PEPCID) 20 MG tablet Take 1 tablet (20 mg total) by mouth daily after supper  03/09/22   Nyoka Cowden, MD  hydrOXYzine (VISTARIL) 25 MG capsule Take 1 capsule (25 mg total) by mouth at bedtime as needed for anxiety (Insmonia). 03/10/22   Anabel Halon, MD  ibuprofen (ADVIL) 600 MG tablet Take 1 tablet (600 mg total) by mouth every 6 (six) hours as needed. 07/29/21   Oliver Barre, MD  metroNIDAZOLE (METROCREAM) 0.75 % cream Apply topically 2 times daily. Apply a dime-size amount to the entire face. 03/14/22     nitroGLYCERIN (NITROSTAT) 0.4 MG SL tablet Place 1 tablet (0.4 mg total) under the tongue every 5 (five) minutes as needed for chest pain. If no relief after 3 doses, call 911 03/15/22 06/13/22  Mallipeddi, Vishnu P, MD  pantoprazole (PROTONIX) 40 MG tablet Take 1 tablet (40 mg total) by mouth daily 30 - 60 minutes before first meal of the day 07/21/22   Nyoka Cowden, MD  prochlorperazine (COMPAZINE) 10 MG tablet Take 1 tablet (10 mg total) by mouth 2 (two) times daily as needed for up to 5 days for nausea or vomiting. 09/10/22 09/15/22  Netta Corrigan, PA-C  triamcinolone cream (KENALOG) 0.1 % Apply 1 Application topically 2 (two) times daily. 09/10/22   Netta Corrigan, PA-C  valACYclovir (VALTREX) 1000 MG tablet Take 1 tablet (1,000 mg total) by mouth 2 (two) times daily. 04/24/22   Anabel Halon, MD  Allergies    Penicillins    Review of Systems   Review of Systems  Gastrointestinal:  Positive for diarrhea.  Skin:  Positive for rash.  See HPI  Physical Exam Updated Vital Signs BP (!) 141/68   Pulse 83   Temp 97.9 F (36.6 C) (Oral)   Resp 18   Ht 5\' 7"  (1.702 m)   Wt 63.5 kg   SpO2 100%   BMI 21.93 kg/m  Physical Exam  ED Results / Procedures / Treatments   Labs (all labs ordered are listed, but only abnormal results are displayed) Labs Reviewed  CBC WITH DIFFERENTIAL/PLATELET - Abnormal; Notable for the following components:      Result Value   Lymphs Abs 0.5 (*)    All other components within normal limits  COMPREHENSIVE  METABOLIC PANEL - Abnormal; Notable for the following components:   Sodium 134 (*)    Calcium 8.8 (*)    All other components within normal limits  URINALYSIS, ROUTINE W REFLEX MICROSCOPIC - Abnormal; Notable for the following components:   APPearance HAZY (*)    Ketones, ur 80 (*)    Protein, ur 30 (*)    All other components within normal limits  SARS CORONAVIRUS 2 BY RT PCR  LIPASE, BLOOD  TROPONIN I (HIGH SENSITIVITY)  TROPONIN I (HIGH SENSITIVITY)    EKG EKG Interpretation  Date/Time:  Sunday September 10 2022 09:18:15 EDT Ventricular Rate:  82 PR Interval:  180 QRS Duration: 98 QT Interval:  367 QTC Calculation: 429 R Axis:   67 Text Interpretation: Sinus rhythm Biatrial enlargement Baseline wander in lead(s) V1 No significant change since prior 6/16 Confirmed by Meridee Score [phone]) on 09/10/2022 9:19:45 AM  Radiology No results found.  Procedures Procedures    Medications Ordered in ED Medications  ondansetron (ZOFRAN) injection 4 mg (4 mg Intravenous Given 09/10/22 0932)  sodium chloride 0.9 % bolus 500 mL (0 mLs Intravenous Stopped 09/10/22 1033)  prochlorperazine (COMPAZINE) injection 5 mg (5 mg Intravenous Given 09/10/22 1032)    ED Course/ Medical Decision Making/ A&P                             Medical Decision Making Amount and/or Complexity of Data Reviewed Labs: ordered.  Risk Prescription drug management.   Christine Marquez 62 y.o. presented today for nausea/diarrhea, rash. Working DDx that I considered at this time includes, but not limited to, viral gastroenteritis, electrolyte abnormalities, dehydration, SJS/TN, dress syndrome, contact dermatitis, allergic dermatitis, shingles, ACS, anemia.  R/o DDx: electrolyte abnormalities, dehydration, SJS/TN, dress syndrome, allergic dermatitis, shingles, ACS, anemia: These are considered less likely due to history of present illness and physical exam findings  Review of prior external notes: 03/18/2019  ED  Unique Tests and My Interpretation:  EKG: Biatrial enlargement, sinus rhythm to 82 bpm, no ST abnormalities or blocks noted COVID: Negative CBC with differential: Unremarkable Troponin: Less than 2 Lipase: Negative UA: Give CMP: Unremarkable  Discussion with Independent Historian: None  Discussion of Management of Tests: None  Risk: Medium: prescription drug management  Risk Stratification Score: none  Plan: Patient presented for nausea/diarrhea, rash. On exam patient was no acute distress and stable vitals.  On exam patient did have rash on bilateral flanks that did appear vesicular in nature and did not follow a dermatome.  Patient endorsed pruritic nature of the rash and recent gardening activity.  Patient denied any new medications or soaps or lotions  and so at this time rash is most likely poison ivy.  Patient will be given Kenalog cream for this as the rash does go into it an intertrigous area with skin folds on her abdomen so I want to avoid the use of clobetasol.  Oral steroids were not used as patient is 62 years old and with increased risk of weakening her bones I do not feel the risks outweigh the benefit of oral steroids at this time and patient may try topical steroids.  In regards to patient's nausea/diarrhea labs will be ordered along with an EKG to further assess.  Patient does have history of mild aortic valve regurgitation and was not given 500 mL of fluids to begin as I suspect she is probably dehydrated as she stated that she has not taken in fluids the past 3 days and endorsing fatigue.  Patient also be given Zofran through the IV.  I suspect at this time patient has a viral gastroenteritis causing her nausea and diarrhea.  Patient stable this time.  Patient's first troponin was less than 2 and since patient has not endorsed any chest pain a second troponin will be discontinued.  Patient labs at this time are reassuring and pending patient's UA patient be discharged  outpatient follow-up and outpatient therapy.  Nurse stated patient was still nauseous after receiving the first round of Zofran and send Zofran has not worked in the outpatient setting or in the ED today patient will be given Compazine to help with her nausea.  Reglan and droperidol were considered however patient is having diarrhea and these medications may increase her GI motility and worsen her diarrhea.  Patient's urine came back negative.  Patient will be p.o. challenged and if successful will be discharged with Compazine and primary care follow-up.  Patient was able to tolerate fluids orally in the ER.  Patient will be discharged on Compazine to help cover for the nausea and diarrhea.  Patient be given Kenalog for suspected contact dermatitis due to poison ivy.  I spoke to the patient about the importance of following up as symptoms may change along with the importance of staying hydrated and taking in food as tolerated.  At this time patient stable for discharge  Patient was given return precautions. Patient stable for discharge at this time.  Patient verbalized understanding of plan.         Final Clinical Impression(s) / ED Diagnoses Final diagnoses:  Poison ivy  Viral gastroenteritis    Rx / DC Orders ED Discharge Orders          Ordered    triamcinolone cream (KENALOG) 0.1 %  2 times daily,   Status:  Discontinued        09/10/22 1010    prochlorperazine (COMPAZINE) 10 MG tablet  2 times daily PRN,   Status:  Discontinued        09/10/22 1124    prochlorperazine (COMPAZINE) 10 MG tablet  2 times daily PRN        09/10/22 1148    triamcinolone cream (KENALOG) 0.1 %  2 times daily        09/10/22 1148              Remi Deter 09/10/22 1210    Terrilee Files, MD 09/10/22 1734

## 2022-09-10 NOTE — Discharge Instructions (Addendum)
Please pick up the medications I prescribed.  The medication I prescribed for you is Compazine that should help with your nausea and diarrhea.  I have also given Kenalog cream for your suspected poison ivy rash.  Please follow-up with a primary care provider in the next few days to be reevaluated ensure that symptoms are resolving.  You may take Claritin or Zyrtec in the morning to help with the itching and take Benadryl at night.  Benadryl is sedating and so please do not drive or operate machinery after taking this medication.  If symptoms begin to worsen please return to ER.

## 2022-09-11 ENCOUNTER — Other Ambulatory Visit: Payer: Self-pay

## 2022-10-04 ENCOUNTER — Other Ambulatory Visit: Payer: Self-pay

## 2022-10-30 ENCOUNTER — Other Ambulatory Visit (HOSPITAL_COMMUNITY): Payer: Self-pay

## 2022-10-30 ENCOUNTER — Other Ambulatory Visit: Payer: Self-pay

## 2022-11-01 ENCOUNTER — Other Ambulatory Visit: Payer: Self-pay

## 2022-11-01 ENCOUNTER — Other Ambulatory Visit (HOSPITAL_COMMUNITY): Payer: Self-pay

## 2022-11-01 MED ORDER — METRONIDAZOLE 0.75 % EX CREA
1.0000 | TOPICAL_CREAM | Freq: Two times a day (BID) | CUTANEOUS | 2 refills | Status: AC
  Filled 2022-11-01: qty 45, 30d supply, fill #0
  Filled 2023-05-02: qty 45, 30d supply, fill #1
  Filled 2023-08-11: qty 45, 30d supply, fill #2

## 2022-12-08 ENCOUNTER — Other Ambulatory Visit: Payer: Self-pay

## 2022-12-15 ENCOUNTER — Other Ambulatory Visit: Payer: Self-pay | Admitting: Internal Medicine

## 2022-12-15 ENCOUNTER — Other Ambulatory Visit: Payer: Self-pay

## 2022-12-15 DIAGNOSIS — J479 Bronchiectasis, uncomplicated: Secondary | ICD-10-CM

## 2022-12-18 ENCOUNTER — Other Ambulatory Visit (HOSPITAL_COMMUNITY): Payer: Self-pay

## 2022-12-18 MED ORDER — PANTOPRAZOLE SODIUM 40 MG PO TBEC
40.0000 mg | DELAYED_RELEASE_TABLET | Freq: Every day | ORAL | 2 refills | Status: DC
Start: 2022-12-18 — End: 2023-10-25
  Filled 2022-12-18: qty 30, 30d supply, fill #0
  Filled 2023-06-03: qty 30, 30d supply, fill #1
  Filled 2023-08-11: qty 30, 30d supply, fill #2

## 2023-01-18 ENCOUNTER — Ambulatory Visit (INDEPENDENT_AMBULATORY_CARE_PROVIDER_SITE_OTHER): Payer: Commercial Managed Care - PPO | Admitting: Internal Medicine

## 2023-01-18 ENCOUNTER — Encounter: Payer: Self-pay | Admitting: Internal Medicine

## 2023-01-18 ENCOUNTER — Other Ambulatory Visit: Payer: Self-pay

## 2023-01-18 ENCOUNTER — Other Ambulatory Visit (HOSPITAL_COMMUNITY): Payer: Self-pay

## 2023-01-18 VITALS — BP 117/69 | HR 82 | Temp 98.0°F | Ht 67.32 in | Wt 144.6 lb

## 2023-01-18 DIAGNOSIS — Z23 Encounter for immunization: Secondary | ICD-10-CM | POA: Diagnosis not present

## 2023-01-18 DIAGNOSIS — F5101 Primary insomnia: Secondary | ICD-10-CM

## 2023-01-18 DIAGNOSIS — E782 Mixed hyperlipidemia: Secondary | ICD-10-CM

## 2023-01-18 DIAGNOSIS — R739 Hyperglycemia, unspecified: Secondary | ICD-10-CM

## 2023-01-18 DIAGNOSIS — E559 Vitamin D deficiency, unspecified: Secondary | ICD-10-CM

## 2023-01-18 DIAGNOSIS — K219 Gastro-esophageal reflux disease without esophagitis: Secondary | ICD-10-CM | POA: Diagnosis not present

## 2023-01-18 DIAGNOSIS — Z0001 Encounter for general adult medical examination with abnormal findings: Secondary | ICD-10-CM

## 2023-01-18 DIAGNOSIS — J309 Allergic rhinitis, unspecified: Secondary | ICD-10-CM | POA: Diagnosis not present

## 2023-01-18 DIAGNOSIS — J479 Bronchiectasis, uncomplicated: Secondary | ICD-10-CM | POA: Diagnosis not present

## 2023-01-18 MED ORDER — FLUTICASONE PROPIONATE 50 MCG/ACT NA SUSP
2.0000 | Freq: Every day | NASAL | 6 refills | Status: AC
Start: 2023-01-18 — End: ?
  Filled 2023-01-18: qty 16, 30d supply, fill #0

## 2023-01-18 MED ORDER — HYDROXYZINE PAMOATE 25 MG PO CAPS
25.0000 mg | ORAL_CAPSULE | Freq: Every evening | ORAL | 5 refills | Status: AC | PRN
Start: 2023-01-18 — End: ?
  Filled 2023-01-18: qty 30, 30d supply, fill #0
  Filled 2023-05-02: qty 30, 30d supply, fill #1
  Filled 2023-08-11: qty 30, 30d supply, fill #2
  Filled 2023-10-25: qty 30, 30d supply, fill #3

## 2023-01-18 NOTE — Progress Notes (Signed)
Established Patient Office Visit  Subjective:  Patient ID: Christine Marquez, female    DOB: [birth year]  Age: 62 y.o. MRN: [id number]  CC:  Chief Complaint  Patient presents with   Annual Exam    HPI Christine Marquez is a 62 y.o. female with past medical history of rosacea and insomnia who presents for annual physical.  GERD: She has been taking pantoprazole every morning and Pepcid every afternoon for it.  She has noticed resolution of chest pain/epigastric pain with it.  She agrees to try only Pepcid for now.  Denies any nausea, vomiting, dysphagia or odynophagia.  Bronchiectasis: She had pulmonology evaluation for CT chest finding of bronchiectasis. Her quant gold was negative.  She was given azithromycin by pulmonology, which she has not required recently.  She denies any chronic cough, dyspnea, wheezing or hemoptysis currently.  Denies any recent weight loss, night sweats or LAD.  She reports intermittent ear fullness, nasal congestion and postnasal drip, which are chronic.  Denies any ear discharge, pain or hearing difficulty. Denies any fever, chills, dyspnea or wheezing currently.     History reviewed. No pertinent past medical history.  Past Surgical History:  Procedure Laterality Date   ABDOMINAL HYSTERECTOMY     HEMORRHOID SURGERY      Family History  Problem Relation Age of Onset   Heart failure Father    Diabetes Other    Cancer Maternal Grandmother    Cancer Maternal Grandfather     Social History   Socioeconomic History   Marital status: Divorced    Spouse name: Not on file   Number of children: 2   Years of education: Not on file   Highest education level: Associate degree: academic program  Occupational History    Comment: RN   Tobacco Use   Smoking status: Former    Current packs/day: 0.00    Types: Cigarettes    Start date: 05/23/2003    Quit date: 05/22/2013    Years since quitting: 9.6   Smokeless tobacco: Never   Tobacco comments:    Smoked 3  cigarettes per day.  Vaping Use   Vaping status: Never Used  Substance and Sexual Activity   Alcohol use: No   Drug use: No   Sexual activity: Not Currently    Birth control/protection: Surgical  Other Topics Concern   Not on file  Social History Narrative   Lives with youngest son who is disabled- had a TBI first year of college   Oldest lives close by-3 grandchildren      Dog: Bell   2 cats: Peppers, Bean       Enjoys: off time with youngest grandson that is 47 years old, weight lifting, planks, and core with walk      Diet: eats all food groups   Caffeine: 1 cup coffee daily at home, at work 3 cups daily   Water: 1 cup daily- needs to work on that      Wears seat belt    Does not use phone while driving    Some detectors at home   Social Determinants of Health   Financial Resource Strain: Low Risk  (09/19/2019)   Overall Financial Resource Strain (CARDIA)    Difficulty of Paying Living Expenses: Not hard at all  Food Insecurity: No Food Insecurity (09/19/2019)   Hunger Vital Sign    Worried About Running Out of Food in the Last Year: Never true    Ran Out of Food in  the Last Year: Never true  Transportation Needs: No Transportation Needs (09/19/2019)   PRAPARE - Administrator, Civil Service (Medical): No    Lack of Transportation (Non-Medical): No  Physical Activity: Insufficiently Active (09/19/2019)   Exercise Vital Sign    Days of Exercise per Week: 4 days    Minutes of Exercise per Session: 30 min  Stress: No Stress Concern Present (09/19/2019)   [employer] of Occupational Health - Occupational Stress Questionnaire    Feeling of Stress : Only a little  Social Connections: Socially Isolated (09/19/2019)   Social Connection and Isolation Panel [NHANES]    Frequency of Communication with Friends and Family: More than three times a week    Frequency of Social Gatherings with Friends and Family: More than three times a week    Attends Religious  Services: Never    Database administrator or Organizations: No    Attends Banker Meetings: Never    Marital Status: Divorced  Catering manager Violence: Not At Risk (09/19/2019)   Humiliation, Afraid, Rape, and Kick questionnaire    Fear of Current or Ex-Partner: No    Emotionally Abused: No    Physically Abused: No    Sexually Abused: No    Outpatient Medications Prior to Visit  Medication Sig Dispense Refill   acetaminophen (TYLENOL) 325 MG tablet Take 650 mg by mouth every 6 (six) hours as needed for moderate pain.     azithromycin (ZITHROMAX) 250 MG tablet Take 2 tablets by mouth on day one then take 1 tablet daily for 4 days 6 tablet 11   famotidine (PEPCID) 20 MG tablet Take 1 tablet (20 mg total) by mouth daily after supper 30 tablet 11   ibuprofen (ADVIL) 600 MG tablet Take 1 tablet (600 mg total) by mouth every 6 (six) hours as needed. 30 tablet 0   metroNIDAZOLE (METROCREAM) 0.75 % cream Apply topically 2 times daily. Apply a dime-size amount to the entire face. 45 g 2   pantoprazole (PROTONIX) 40 MG tablet Take 1 tablet (40 mg total) by mouth daily 30 - 60 minutes before first meal of the day 30 tablet 2   valACYclovir (VALTREX) 1000 MG tablet Take 1 tablet (1,000 mg total) by mouth 2 (two) times daily. 30 tablet 1   hydrOXYzine (VISTARIL) 25 MG capsule Take 1 capsule (25 mg total) by mouth at bedtime as needed for anxiety (Insmonia). 30 capsule 4   triamcinolone cream (KENALOG) 0.1 % Apply 1 Application topically 2 (two) times daily. 30 g 0   nitroGLYCERIN (NITROSTAT) 0.4 MG SL tablet Place 1 tablet (0.4 mg total) under the tongue every 5 (five) minutes as needed for chest pain. If no relief after 3 doses, call 911 90 tablet 3   prochlorperazine (COMPAZINE) 10 MG tablet Take 1 tablet (10 mg total) by mouth 2 (two) times daily as needed for up to 5 days for nausea or vomiting. 10 tablet 0   No facility-administered medications prior to visit.    Allergies   Allergen Reactions   Penicillins Hives    ROS Review of Systems  Constitutional:  Negative for chills and fever.  HENT:  Positive for congestion. Negative for sore throat.        Bilateral ear fullness  Respiratory:  Negative for cough and shortness of breath.   Cardiovascular:  Negative for chest pain and palpitations.  Gastrointestinal:  Negative for nausea and vomiting.  Genitourinary:  Negative for dysuria and hematuria.  Musculoskeletal:  Negative for neck pain and neck stiffness.  Skin:  Negative for rash.  Neurological:  Negative for dizziness and weakness.  Psychiatric/Behavioral:  Negative for agitation and behavioral problems.       Objective:    Physical Exam Constitutional:      General: She is not in acute distress.    Appearance: She is not diaphoretic.  HENT:     Head: Normocephalic and atraumatic.     Right Ear: Ear canal and external ear normal. There is no impacted cerumen.     Left Ear: Ear canal and external ear normal. There is no impacted cerumen.     Nose: Nose normal.     Mouth/Throat:     Mouth: Mucous membranes are moist.     Pharynx: No posterior oropharyngeal erythema.  Eyes:     General: No scleral icterus.    Extraocular Movements: Extraocular movements intact.  Cardiovascular:     Rate and Rhythm: Normal rate and regular rhythm.     Heart sounds: Normal heart sounds. No murmur heard. Pulmonary:     Breath sounds: Normal breath sounds. No wheezing or rales.  Chest:     Chest wall: No tenderness.  Abdominal:     Palpations: Abdomen is soft.     Tenderness: There is no abdominal tenderness.  Musculoskeletal:     Cervical back: Neck supple. No tenderness.     Right lower leg: No edema.     Left lower leg: No edema.  Skin:    General: Skin is warm.     Findings: No rash.  Neurological:     General: No focal deficit present.     Mental Status: She is alert and oriented to person, place, and time.     Sensory: No sensory deficit.      Motor: No weakness.  Psychiatric:        Mood and Affect: Mood normal.        Behavior: Behavior normal.     BP 117/69   Pulse 82   Temp 98 F (36.7 C)   Ht 5' 7.32" (1.71 m)   Wt 144 lb 9.6 oz (65.6 kg)   SpO2 (!) 86%   BMI 22.43 kg/m  Wt Readings from Last 3 Encounters:  01/18/23 144 lb 9.6 oz (65.6 kg)  09/10/22 140 lb (63.5 kg)  04/25/22 143 lb 3.2 oz (65 kg)    Lab Results  Component Value Date   TSH 0.834 03/09/2021   Lab Results  Component Value Date   WBC 5.5 09/10/2022   HGB 14.3 09/10/2022   HCT 43.0 09/10/2022   MCV 96.4 09/10/2022   PLT 170 09/10/2022   Lab Results  Component Value Date   NA 134 (L) 09/10/2022   K 3.7 09/10/2022   CO2 25 09/10/2022   GLUCOSE 91 09/10/2022   BUN 19 09/10/2022   CREATININE 0.69 09/10/2022   BILITOT 0.6 09/10/2022   ALKPHOS 82 09/10/2022   AST 27 09/10/2022   ALT 24 09/10/2022   PROT 7.2 09/10/2022   ALBUMIN 4.1 09/10/2022   CALCIUM 8.8 (L) 09/10/2022   ANIONGAP 10 09/10/2022   EGFR 98 03/09/2021   Lab Results  Component Value Date   CHOL 188 03/09/2021   Lab Results  Component Value Date   HDL 92 03/09/2021   Lab Results  Component Value Date   LDLCALC 85 03/09/2021   Lab Results  Component Value Date   TRIG 57 03/09/2021   Lab Results  Component Value Date   CHOLHDL 2.0 03/09/2021   Lab Results  Component Value Date   HGBA1C 5.4 03/09/2021      Assessment & Plan:   Problem List Items Addressed This Visit       Respiratory   Bronchiectasis without complication [facility])    Had pulmonology evaluation - was given azithromycin Quant TB negative, allergy screen benign Symptoms resolved now      Relevant Orders   CMP14+EGFR   CBC with Differential/Platelet   Allergic sinusitis    Postnasal drip and ear fullness could be due to an allergic sinusitis Advised to use Flonase      Relevant Medications   fluticasone (FLONASE) 50 MCG/ACT nasal spray     Digestive   Gastroesophageal reflux  disease    Her chest pain/epigastric discomfort was likely due to GERD Now well controlled with pantoprazole every morning and Pepcid every afternoon Advised to avoid pantoprazole due to risk of osteoporosis - can continue Pepcid        Other   Primary insomnia    Did not have effect from Lunesta Better with Vistaril 25 mg qHS PRN Maintain sleep hygiene      Relevant Medications   hydrOXYzine (VISTARIL) 25 MG capsule   Other Relevant Orders   TSH   Encounter for general adult medical examination with abnormal findings - Primary    Physical exam as documented. Counseling done  re healthy lifestyle involving commitment to 150 minutes exercise per week, heart healthy diet, and attaining healthy weight.The importance of adequate sleep also discussed. Changes in health habits are decided on by the patient with goals and time frames  set for achieving them. Immunization and cancer screening needs are specifically addressed at this visit.      Other Visit Diagnoses     Mixed hyperlipidemia       Relevant Orders   Lipid panel   Hyperglycemia       Relevant Orders   Hemoglobin A1c   Vitamin D deficiency       Relevant Orders   VITAMIN D 25 Hydroxy (Vit-D Deficiency, Fractures)   Need for vaccination       Relevant Orders   Tdap vaccine greater than or equal to 7yo IM (Completed)       Meds ordered this encounter  Medications   hydrOXYzine (VISTARIL) 25 MG capsule    Sig: Take 1 capsule (25 mg total) by mouth at bedtime as needed for anxiety (Insmonia).    Dispense:  30 capsule    Refill:  5   fluticasone (FLONASE) 50 MCG/ACT nasal spray    Sig: Place 2 sprays into both nostrils daily.    Dispense:  16 g    Refill:  6    Follow-up: No follow-ups on file.    Anabel Halon, MD

## 2023-01-18 NOTE — Assessment & Plan Note (Signed)

## 2023-01-18 NOTE — Assessment & Plan Note (Signed)
Had pulmonology evaluation - was given azithromycin Quant TB negative, allergy screen benign Symptoms resolved now

## 2023-01-18 NOTE — Assessment & Plan Note (Signed)
Did not have effect from Lunesta Better with Vistaril 25 mg qHS PRN Maintain sleep hygiene

## 2023-01-18 NOTE — Patient Instructions (Signed)
Please take Pepcid for acid reflux. Okay to take Pantoprazole as needed for resistant acid reflux symptoms.  Please continue to take other medications as prescribed.  Please continue to follow low salt diet and perform moderate exercise/walking at least 150 mins/week.

## 2023-01-18 NOTE — Assessment & Plan Note (Signed)
Postnasal drip and ear fullness could be due to an allergic sinusitis Advised to use Flonase

## 2023-01-18 NOTE — Assessment & Plan Note (Signed)
Her chest pain/epigastric discomfort was likely due to GERD Now well controlled with pantoprazole every morning and Pepcid every afternoon Advised to avoid pantoprazole due to risk of osteoporosis - can continue Pepcid

## 2023-01-19 ENCOUNTER — Other Ambulatory Visit: Payer: Self-pay

## 2023-01-19 ENCOUNTER — Encounter: Payer: Commercial Managed Care - PPO | Admitting: Internal Medicine

## 2023-01-19 ENCOUNTER — Other Ambulatory Visit (HOSPITAL_COMMUNITY): Payer: Self-pay

## 2023-01-19 LAB — LIPID PANEL
Chol/HDL Ratio: 2.3 ratio (ref 0.0–4.4)
Cholesterol, Total: 208 mg/dL — ABNORMAL HIGH (ref 100–199)
HDL: 89 mg/dL (ref >39)
LDL Chol Calc (NIH): 102 mg/dL — ABNORMAL HIGH (ref 0–99)
Triglycerides: 97 mg/dL (ref 0–149)
VLDL Cholesterol Cal: 17 mg/dL (ref 5–40)

## 2023-01-19 LAB — CBC WITH DIFFERENTIAL/PLATELET
Basophils Absolute: 0 10*3/uL (ref 0.0–0.2)
Basos: 1 %
EOS (ABSOLUTE): 0.1 10*3/uL (ref 0.0–0.4)
Eos: 1 %
Hematocrit: 40.4 % (ref 34.0–46.6)
Hemoglobin: 13.1 g/dL (ref 11.1–15.9)
Immature Grans (Abs): 0 10*3/uL (ref 0.0–0.1)
Immature Granulocytes: 0 %
Lymphocytes Absolute: 1.6 10*3/uL (ref 0.7–3.1)
Lymphs: 31 %
MCH: 31 pg (ref 26.6–33.0)
MCHC: 32.4 g/dL (ref 31.5–35.7)
MCV: 96 fL (ref 79–97)
Monocytes Absolute: 0.5 10*3/uL (ref 0.1–0.9)
Monocytes: 9 %
Neutrophils Absolute: 3.1 10*3/uL (ref 1.4–7.0)
Neutrophils: 58 %
Platelets: 216 10*3/uL (ref 150–450)
RBC: 4.22 x10E6/uL (ref 3.77–5.28)
RDW: 12.2 % (ref 11.7–15.4)
WBC: 5.2 10*3/uL (ref 3.4–10.8)

## 2023-01-19 LAB — CMP14+EGFR
ALT: 19 IU/L (ref 0–32)
AST: 25 IU/L (ref 0–40)
Albumin: 4.7 g/dL (ref 3.9–4.9)
Alkaline Phosphatase: 115 IU/L (ref 44–121)
BUN/Creatinine Ratio: 16 (ref 12–28)
BUN: 13 mg/dL (ref 8–27)
Bilirubin Total: 0.4 mg/dL (ref 0.0–1.2)
CO2: 25 mmol/L (ref 20–29)
Calcium: 9.8 mg/dL (ref 8.7–10.3)
Chloride: 102 mmol/L (ref 96–106)
Creatinine, Ser: 0.82 mg/dL (ref 0.57–1.00)
Globulin, Total: 2.4 g/dL (ref 1.5–4.5)
Glucose: 80 mg/dL (ref 70–99)
Potassium: 4.7 mmol/L (ref 3.5–5.2)
Sodium: 140 mmol/L (ref 134–144)
Total Protein: 7.1 g/dL (ref 6.0–8.5)
eGFR: 80 mL/min/{1.73_m2} (ref >59)

## 2023-01-19 LAB — TSH: TSH: 0.957 u[IU]/mL (ref 0.450–4.500)

## 2023-01-19 LAB — VITAMIN D 25 HYDROXY (VIT D DEFICIENCY, FRACTURES): Vit D, 25-Hydroxy: 44.9 ng/mL (ref 30.0–100.0)

## 2023-01-19 LAB — HEMOGLOBIN A1C
Est. average glucose Bld gHb Est-mCnc: 111 mg/dL
Hgb A1c MFr Bld: 5.5 % (ref 4.8–5.6)

## 2023-02-07 ENCOUNTER — Other Ambulatory Visit: Payer: Self-pay

## 2023-04-04 DIAGNOSIS — H524 Presbyopia: Secondary | ICD-10-CM | POA: Diagnosis not present

## 2023-04-04 DIAGNOSIS — H47093 Other disorders of optic nerve, not elsewhere classified, bilateral: Secondary | ICD-10-CM | POA: Diagnosis not present

## 2023-04-04 DIAGNOSIS — H2513 Age-related nuclear cataract, bilateral: Secondary | ICD-10-CM | POA: Diagnosis not present

## 2023-04-26 NOTE — Progress Notes (Signed)
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   Bad pna 2006  > [facility]  chest 06/2005 and 06/2006 did not show bronchiectasis   Winter 6/23 bad uri > UC > rx albuterol rec not used    History of Present Illness  03/09/2022  Pulmonary/ 1st office eval/ Christine Marquez / [employer] Complaint  Patient presents with   Consult  Dyspnea:  only with ex x sev months Cough: occ cough to point of vomiting sev times a month  Sleep: on side flat 2 pillows  SABA use: not using now/ doesn't feel it helped any of her symptoms Cp midline shooting to back, no nausea, never supine and more likely  sitting > walking  Rec Pantoprazole (protonix) 40 mg  Take  30-60 min before first meal of the day and Pepcid (famotidine)  20 mg after supper until return to office  GERD diet reviewed, bed blocks rec    04/25/2022  f/u ov/[place] office/Christine Marquez re: bronchiectasis  maint on gerd rx  cxr today  Chief Complaint  Patient presents with   Follow-up    Cough has improved     Dyspnea:  chasing grandchildren s sob  Cough: slt am cough / min mucoid  Sleeping: flat / 3 pillows  SABA use: none  02: none  Covid status: 4 vax/ once infected  Lung cancer screening: not eligible  Rec For cough/ congestion >  mucinex dm 1200 mg every 12 hours as needed  For nasty mucus > zpak as needed as long as it turns  the mucus clear again   04/27/2023  Yearly f/u ov/[place] office/Christine Marquez re: bronchiectasis  maint on prn mucinex   Chief Complaint  Patient presents with   Follow-up    1 year follow up patient has no new concerns   Dyspnea:  ok chasing grandkids  Cough: cough in am > min mucoid  Sleeping: bed is  flat 3 pillows s   resp cc  SABA use: has not using  02: none  Lung cancer screening: rec today if eligible    No obvious day to day or daytime variability or assoc excess/ purulent sputum or mucus plugs or hemoptysis or cp or chest tightness, subjective wheeze or overt sinus or hb symptoms.    Also denies any obvious fluctuation of symptoms with weather or environmental changes or other aggravating or alleviating factors except as outlined above   No unusual exposure hx or h/o childhood pna/ asthma or knowledge of premature birth.  Current Allergies, Complete Past Medical History, Past Surgical History, Family History, and Social History were reviewed in [employer] record.  ROS  The following are not active complaints unless bolded Hoarseness, sore throat, dysphagia, dental problems, itching, sneezing,  nasal congestion or discharge of excess mucus or purulent secretions, ear ache,   fever, chills, sweats, unintended wt loss or wt gain, classically pleuritic or exertional cp,  orthopnea pnd or arm/hand swelling  or leg swelling, presyncope, palpitations, abdominal pain, anorexia, nausea, vomiting, diarrhea  or change in bowel habits or change  in bladder habits, change in stools or change in urine, dysuria, hematuria,  rash, arthralgias, visual complaints, headache, numbness, weakness or ataxia or problems with walking or coordination,  change in mood or  memory.        Current Meds  Medication Sig   acetaminophen (TYLENOL) 325 MG tablet Take 650 mg by mouth every 6 (six) hours as needed for moderate pain.   azithromycin (ZITHROMAX) 250 MG tablet Take 2 tablets by mouth on day one then take 1 tablet daily for 4 days   famotidine (PEPCID) 20 MG tablet Take 1 tablet (20 mg total) by mouth daily after supper   fluticasone (FLONASE) 50 MCG/ACT nasal spray Place 2 sprays into both nostrils daily.   hydrOXYzine (VISTARIL) 25 MG capsule Take 1 capsule (25 mg total) by mouth  at bedtime as needed for anxiety (Insmonia).   ibuprofen (ADVIL) 600 MG tablet Take 1 tablet (600 mg total) by mouth every 6 (six) hours as needed.   metroNIDAZOLE (METROCREAM) 0.75 % cream Apply topically 2 times daily. Apply a dime-size amount to the entire face.   pantoprazole (PROTONIX) 40 MG tablet Take 1 tablet (40 mg total) by mouth daily 30 - 60 minutes before first meal of the day   valACYclovir (VALTREX) 1000 MG tablet Take 1 tablet (1,000 mg total) by mouth 2 (two) times daily.                 Objective:    wts  04/27/2023        147   04/25/22 143 lb 3.2 oz (65 kg)  04/21/22 143 lb (64.9 kg)  03/15/22 145 lb (65.8 kg)     Vital signs reviewed  04/27/2023  - Note at rest 02 sats  96% on RA   General appearance:    pleasant amb wf nad    HEENT : Oropharynx  clear         NECK :  without  apparent JVD/ palpable Nodes/TM    LUNGS: no acc muscle use,  Nl contour chest which is clear to A and P bilaterally without cough on insp or exp maneuvers   CV:  RRR  no s3 or murmur or increase in P2, and no edema   ABD:  soft and nontender with nl inspiratory excursion in the supine position. No bruits or organomegaly appreciated   MS:  Nl gait/ ext warm without deformities Or obvious joint restrictions  calf tenderness, cyanosis  but subtle clubbing is present    SKIN: warm and dry without lesions    NEURO:  alert, approp, nl sensorium with  no motor or cerebellar deficits apparent.            Assessment           [employer]

## 2023-04-27 ENCOUNTER — Ambulatory Visit: Payer: Commercial Managed Care - PPO | Admitting: Internal Medicine

## 2023-04-27 ENCOUNTER — Encounter: Payer: Self-pay | Admitting: Pharmacist

## 2023-04-27 ENCOUNTER — Encounter: Payer: Self-pay | Admitting: Internal Medicine

## 2023-04-27 ENCOUNTER — Other Ambulatory Visit: Payer: Self-pay

## 2023-04-27 ENCOUNTER — Other Ambulatory Visit (HOSPITAL_COMMUNITY): Payer: Self-pay

## 2023-04-27 VITALS — BP 121/71 | HR 85 | Ht 67.32 in | Wt 147.0 lb

## 2023-04-27 DIAGNOSIS — R0609 Other forms of dyspnea: Secondary | ICD-10-CM

## 2023-04-27 DIAGNOSIS — J479 Bronchiectasis, uncomplicated: Secondary | ICD-10-CM | POA: Diagnosis not present

## 2023-04-27 DIAGNOSIS — Z87891 Personal history of nicotine dependence: Secondary | ICD-10-CM | POA: Diagnosis not present

## 2023-04-27 MED ORDER — AZITHROMYCIN 250 MG PO TABS
ORAL_TABLET | ORAL | 11 refills | Status: AC
  Filled 2023-04-27: qty 6, 5d supply, fill #0

## 2023-04-27 NOTE — Assessment & Plan Note (Signed)
Onset of symptoms summer of 2023 with "URI"  -  Chest CT w/o contrast   03/03/22 1. Cylindrical bronchiectasis, peribronchovascular nodularity and scattered mucoid impaction in the upper lobes and right middle lobe - Quant Ig's 03/09/2022 wnl  - Quant TB 03/09/2022  Neg  -Labs ordered 03/09/2022  :  allergy screen Eos 0.0/ IgE 8  alpha one AT phenotype MM  Level 150   Rx zmax prn/ mucinex dm and add flutter if needed

## 2023-04-27 NOTE — Assessment & Plan Note (Signed)
Low-dose CT lung cancer screening is recommended for patients who are 20-64 years of age with a 20+ pack-year history of smoking and who are currently smoking or quit <=15 years ago. No coughing up blood  No unintentional weight loss of > 15 pounds in the last 6 months - pt is eligible for scanning yearly until 2030 if eligible / advised  F/u here q 12 m, call sooner if needed         Each maintenance medication was reviewed in detail including emphasizing most importantly the difference between maintenance and prns and under what circumstances the prns are to be triggered using an action plan format where appropriate.  Total time for H and P, chart review, counseling, reviewing hfa/flutter  device(s) and generating customized AVS unique to this office visit / same day charting = 25 min

## 2023-04-27 NOTE — Patient Instructions (Addendum)
My office will be contacting you by phone for referral to Lung cancer progream  [phone]  - if you don't hear back from my office within one week please call us back or notify us thru MyChart and we'll address it right away.   For cough/ congestion > mucinex or mucinex dm  up to maximum of  1200 mg every 12 hours and if needed use a flutter valve as much as you can if having trouble coughing up thick mucus   Please schedule a follow up visit in 12  months but call sooner if needed

## 2023-05-02 ENCOUNTER — Other Ambulatory Visit: Payer: Self-pay | Admitting: Internal Medicine

## 2023-05-02 DIAGNOSIS — J479 Bronchiectasis, uncomplicated: Secondary | ICD-10-CM

## 2023-05-03 ENCOUNTER — Other Ambulatory Visit: Payer: Self-pay

## 2023-05-03 ENCOUNTER — Other Ambulatory Visit (HOSPITAL_COMMUNITY): Payer: Self-pay

## 2023-05-03 MED ORDER — FAMOTIDINE 20 MG PO TABS
20.0000 mg | ORAL_TABLET | Freq: Every day | ORAL | 11 refills | Status: AC
Start: 2023-05-03 — End: ?
  Filled 2023-05-03: qty 30, 30d supply, fill #0
  Filled 2023-06-03: qty 30, 30d supply, fill #1
  Filled 2023-08-11: qty 30, 30d supply, fill #2
  Filled 2023-10-25: qty 30, 30d supply, fill #3
  Filled 2024-02-16: qty 30, 30d supply, fill #4

## 2023-06-04 ENCOUNTER — Other Ambulatory Visit: Payer: Self-pay

## 2023-08-12 ENCOUNTER — Other Ambulatory Visit: Payer: Self-pay | Admitting: Internal Medicine

## 2023-08-12 ENCOUNTER — Other Ambulatory Visit (HOSPITAL_COMMUNITY): Payer: Self-pay

## 2023-08-13 ENCOUNTER — Other Ambulatory Visit: Payer: Self-pay

## 2023-08-13 ENCOUNTER — Other Ambulatory Visit (HOSPITAL_COMMUNITY): Payer: Self-pay

## 2023-08-13 MED ORDER — VALACYCLOVIR HCL 1 G PO TABS
1000.0000 mg | ORAL_TABLET | Freq: Two times a day (BID) | ORAL | 1 refills | Status: AC
  Filled 2023-08-13: qty 30, 15d supply, fill #0
  Filled 2023-10-25: qty 30, 15d supply, fill #1

## 2023-09-17 ENCOUNTER — Encounter

## 2023-10-25 ENCOUNTER — Other Ambulatory Visit: Payer: Self-pay

## 2023-10-25 ENCOUNTER — Other Ambulatory Visit (HOSPITAL_COMMUNITY): Payer: Self-pay

## 2023-10-25 ENCOUNTER — Other Ambulatory Visit: Payer: Self-pay | Admitting: Internal Medicine

## 2023-10-25 DIAGNOSIS — J479 Bronchiectasis, uncomplicated: Secondary | ICD-10-CM

## 2023-10-25 MED ORDER — PANTOPRAZOLE SODIUM 40 MG PO TBEC
40.0000 mg | DELAYED_RELEASE_TABLET | Freq: Every day | ORAL | 2 refills | Status: AC
Start: 2023-10-25 — End: ?
  Filled 2023-10-25: qty 30, 30d supply, fill #0
  Filled 2024-02-16: qty 30, 30d supply, fill #1

## 2024-02-16 ENCOUNTER — Other Ambulatory Visit: Payer: Self-pay | Admitting: Internal Medicine

## 2024-02-16 ENCOUNTER — Other Ambulatory Visit (HOSPITAL_COMMUNITY): Payer: Self-pay

## 2024-02-16 DIAGNOSIS — F5101 Primary insomnia: Secondary | ICD-10-CM

## 2024-02-18 ENCOUNTER — Other Ambulatory Visit: Payer: Self-pay

## 2024-02-19 ENCOUNTER — Other Ambulatory Visit (HOSPITAL_COMMUNITY): Payer: Self-pay

## 2024-02-19 ENCOUNTER — Encounter (HOSPITAL_COMMUNITY): Payer: Self-pay

## 2024-04-04 DIAGNOSIS — H47093 Other disorders of optic nerve, not elsewhere classified, bilateral: Secondary | ICD-10-CM | POA: Diagnosis not present

## 2024-04-04 DIAGNOSIS — H11442 Conjunctival cysts, left eye: Secondary | ICD-10-CM | POA: Diagnosis not present

## 2024-04-04 DIAGNOSIS — H2513 Age-related nuclear cataract, bilateral: Secondary | ICD-10-CM | POA: Diagnosis not present

## 2024-05-01 ENCOUNTER — Ambulatory Visit: Payer: Self-pay

## 2024-05-01 NOTE — Telephone Encounter (Signed)
 Contacted pt as missed note from [facility] that they could schedule. Pt states she did not schedule as no openings in [place]. This RN confirmed no openings and provided pt with closest UC location to her home. Advised to call back if symptoms do not improve or if they return.

## 2024-05-01 NOTE — Telephone Encounter (Deleted)
 Copied from CRM #[id number]. Topic: Clinical - Red Word Triage >> May 01, 2024 10:37 AM Antwanette L wrote: Red Word that prompted transfer to Nurse Triage: Possible flu. The patient is experiencing coughing to the point of vomiting, along with chills and sinus drainage. >> May 01, 2024 10:42 AM Antwanette L wrote: Please disregard this crm. I can schedule the patient
